# Patient Record
Sex: Female | Born: 1935
Health system: Southern US, Community
[De-identification: ages and names within clinical notes are randomized; demographics above are authoritative.]

## PROBLEM LIST (undated history)

## (undated) DIAGNOSIS — I10 Essential (primary) hypertension: Secondary | ICD-10-CM

## (undated) HISTORY — DX: Essential (primary) hypertension: I10

---

## 1998-08-31 HISTORY — PX: CATARACT EXTRACTION: SUR2

## 1999-08-03 ENCOUNTER — Encounter: Payer: Self-pay | Admitting: Family Medicine

## 1999-08-03 ENCOUNTER — Encounter: Admission: RE | Admit: 1999-08-03 | Discharge: 1999-08-03 | Payer: Self-pay | Admitting: Family Medicine

## 2000-08-14 ENCOUNTER — Encounter: Payer: Self-pay | Admitting: Gynecology

## 2000-08-14 ENCOUNTER — Encounter: Admission: RE | Admit: 2000-08-14 | Discharge: 2000-08-14 | Payer: Self-pay | Admitting: Gynecology

## 2000-09-25 ENCOUNTER — Other Ambulatory Visit: Admission: RE | Admit: 2000-09-25 | Discharge: 2000-09-25 | Payer: Self-pay | Admitting: Gynecology

## 2000-09-28 ENCOUNTER — Encounter: Payer: Self-pay | Admitting: Gynecology

## 2000-09-28 ENCOUNTER — Encounter: Admission: RE | Admit: 2000-09-28 | Discharge: 2000-09-28 | Payer: Self-pay | Admitting: Gynecology

## 2001-10-09 ENCOUNTER — Other Ambulatory Visit: Admission: RE | Admit: 2001-10-09 | Discharge: 2001-10-09 | Payer: Self-pay | Admitting: Gynecology

## 2001-10-23 ENCOUNTER — Encounter: Admission: RE | Admit: 2001-10-23 | Discharge: 2001-10-23 | Payer: Self-pay | Admitting: Gynecology

## 2001-10-23 ENCOUNTER — Encounter: Payer: Self-pay | Admitting: Gynecology

## 2002-10-22 ENCOUNTER — Encounter: Payer: Self-pay | Admitting: Gynecology

## 2002-10-22 ENCOUNTER — Encounter: Admission: RE | Admit: 2002-10-22 | Discharge: 2002-10-22 | Payer: Self-pay | Admitting: Gynecology

## 2002-11-05 ENCOUNTER — Other Ambulatory Visit: Admission: RE | Admit: 2002-11-05 | Discharge: 2002-11-05 | Payer: Self-pay | Admitting: Gynecology

## 2003-11-04 ENCOUNTER — Encounter: Admission: RE | Admit: 2003-11-04 | Discharge: 2003-11-04 | Payer: Self-pay | Admitting: Gynecology

## 2003-12-02 ENCOUNTER — Other Ambulatory Visit: Admission: RE | Admit: 2003-12-02 | Discharge: 2003-12-02 | Payer: Self-pay | Admitting: Gynecology

## 2003-12-25 ENCOUNTER — Ambulatory Visit (HOSPITAL_COMMUNITY): Admission: RE | Admit: 2003-12-25 | Discharge: 2003-12-25 | Payer: Self-pay | Admitting: Gynecology

## 2004-11-03 ENCOUNTER — Encounter: Admission: RE | Admit: 2004-11-03 | Discharge: 2004-11-03 | Payer: Self-pay | Admitting: Gynecology

## 2004-11-16 ENCOUNTER — Other Ambulatory Visit: Admission: RE | Admit: 2004-11-16 | Discharge: 2004-11-16 | Payer: Self-pay | Admitting: Gynecology

## 2005-11-07 ENCOUNTER — Encounter: Admission: RE | Admit: 2005-11-07 | Discharge: 2005-11-07 | Payer: Self-pay | Admitting: Gynecology

## 2005-11-22 ENCOUNTER — Other Ambulatory Visit: Admission: RE | Admit: 2005-11-22 | Discharge: 2005-11-22 | Payer: Self-pay | Admitting: Gynecology

## 2006-11-10 ENCOUNTER — Encounter: Admission: RE | Admit: 2006-11-10 | Discharge: 2006-11-10 | Payer: Self-pay | Admitting: Gynecology

## 2006-12-05 ENCOUNTER — Other Ambulatory Visit: Admission: RE | Admit: 2006-12-05 | Discharge: 2006-12-05 | Payer: Self-pay | Admitting: Gynecology

## 2007-06-13 ENCOUNTER — Encounter: Admission: RE | Admit: 2007-06-13 | Discharge: 2007-06-13 | Payer: Self-pay | Admitting: Family Medicine

## 2007-10-20 ENCOUNTER — Inpatient Hospital Stay (HOSPITAL_COMMUNITY): Admission: AD | Admit: 2007-10-20 | Discharge: 2007-10-20 | Payer: Self-pay | Admitting: Obstetrics & Gynecology

## 2007-11-14 ENCOUNTER — Encounter: Admission: RE | Admit: 2007-11-14 | Discharge: 2007-11-14 | Payer: Self-pay | Admitting: Gynecology

## 2007-12-25 ENCOUNTER — Encounter: Admission: RE | Admit: 2007-12-25 | Discharge: 2007-12-25 | Payer: Self-pay | Admitting: Family Medicine

## 2008-09-29 ENCOUNTER — Encounter: Admission: RE | Admit: 2008-09-29 | Discharge: 2008-09-29 | Payer: Self-pay | Admitting: Family Medicine

## 2008-11-19 ENCOUNTER — Encounter: Admission: RE | Admit: 2008-11-19 | Discharge: 2008-11-19 | Payer: Self-pay | Admitting: Gynecology

## 2009-12-02 ENCOUNTER — Encounter: Admission: RE | Admit: 2009-12-02 | Discharge: 2009-12-02 | Payer: Self-pay | Admitting: Gynecology

## 2009-12-10 ENCOUNTER — Encounter: Admission: RE | Admit: 2009-12-10 | Discharge: 2009-12-10 | Payer: Self-pay | Admitting: Gynecology

## 2010-04-04 ENCOUNTER — Encounter: Payer: Self-pay | Admitting: Family Medicine

## 2010-07-04 IMAGING — US US PELVIS COMPLETE
1 series · 14 of 25 positions shown · non-contrast
Comparison: None.

CLINICAL DATA: Lower abdominal pain and bloating.  Weight loss.
History of ovarian cyst removal and pessary.

TRANSABDOMINAL ULTRASOUND OF PELVIS 09/29/2008:
TECHNIQUE: Transabdominal ultrasound examination of the pelvis was
performed including evaluation of the uterus, ovaries, adnexal
regions, and pelvic cul-de-sac.

[Series 1: us pelvis complete · 0.29mm/px · 14 of 31 slices shown]
[im 1/31]
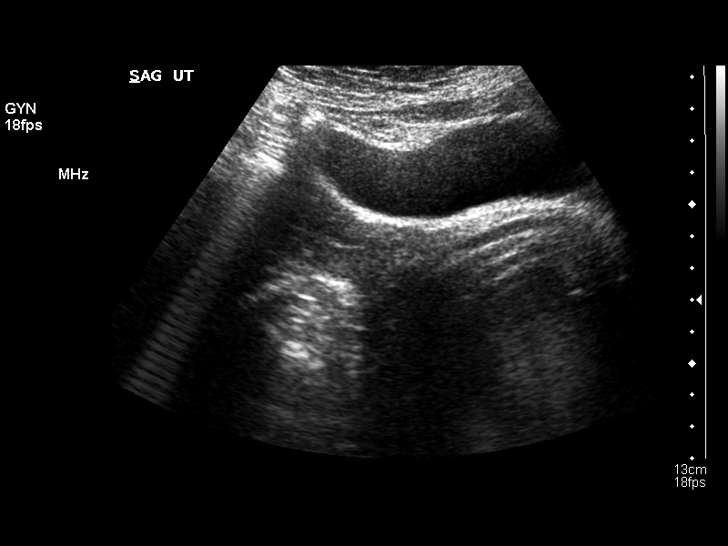
[im 3/31]
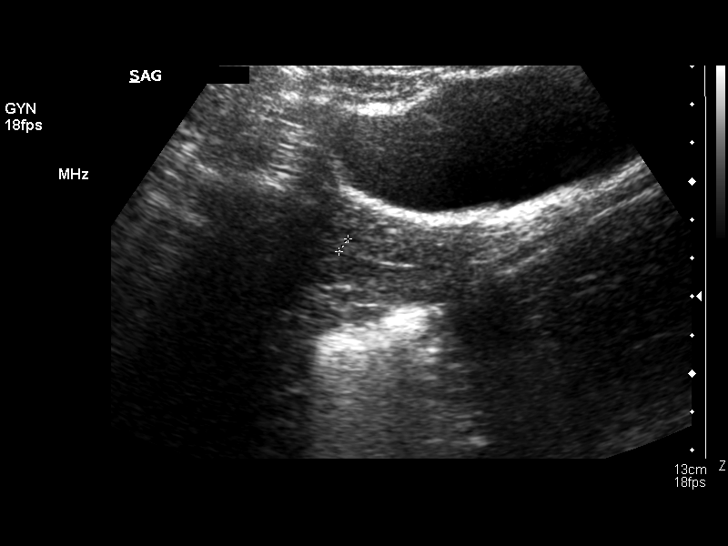
[im 6/31]
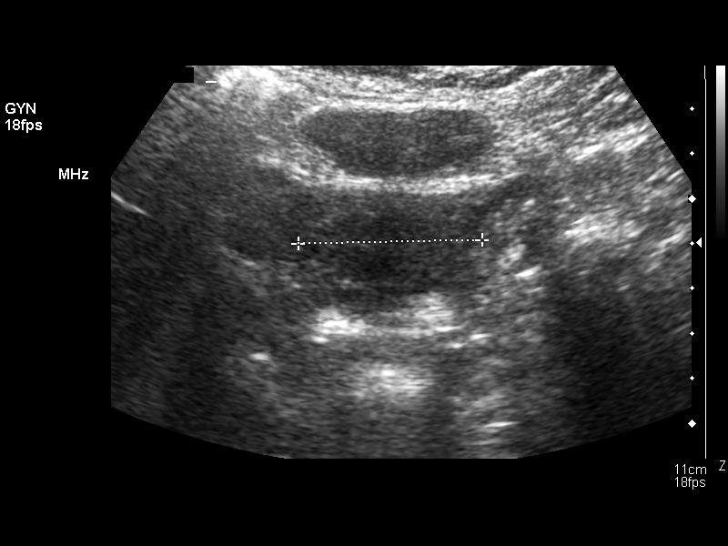
[im 8/31]
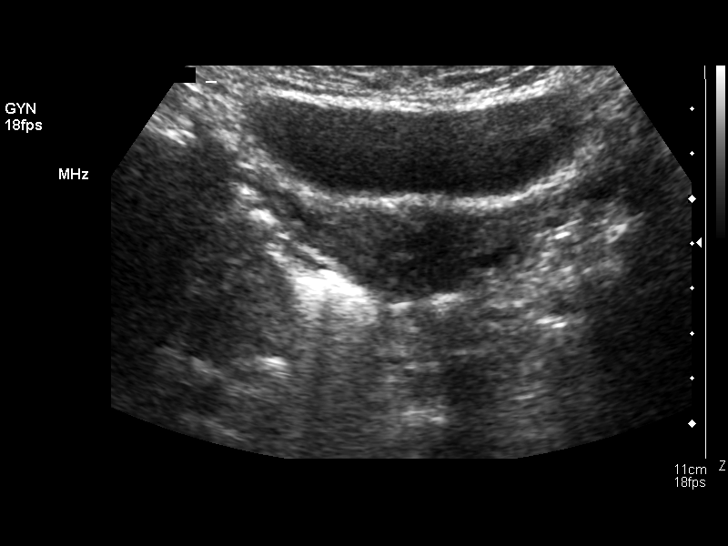
[im 11/31]
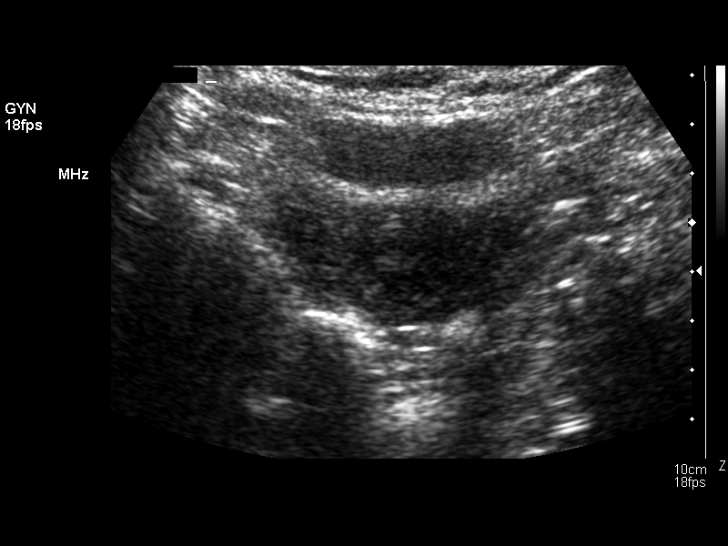
[im 12/31]
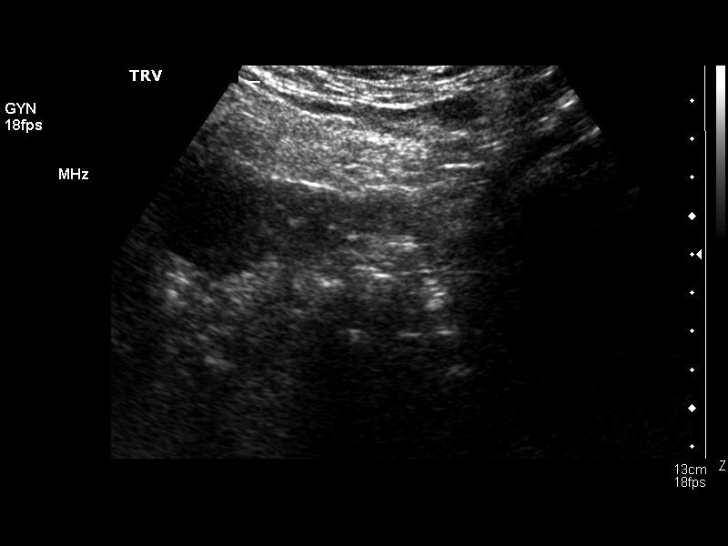
[im 14/31]
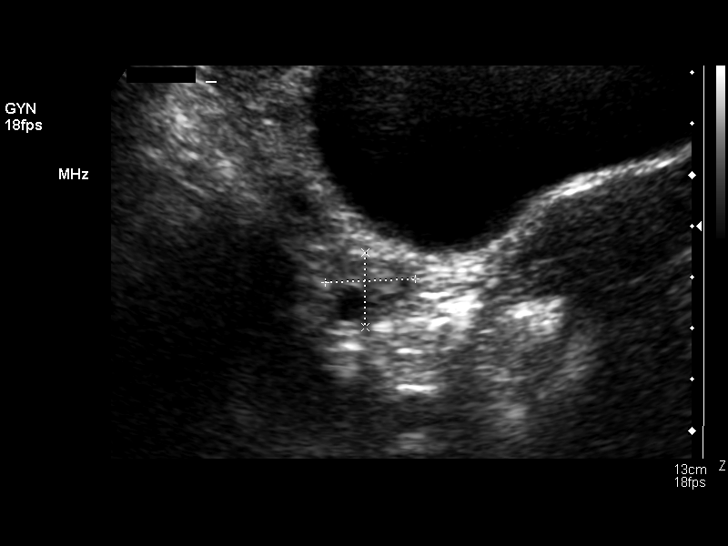
[im 17/31]
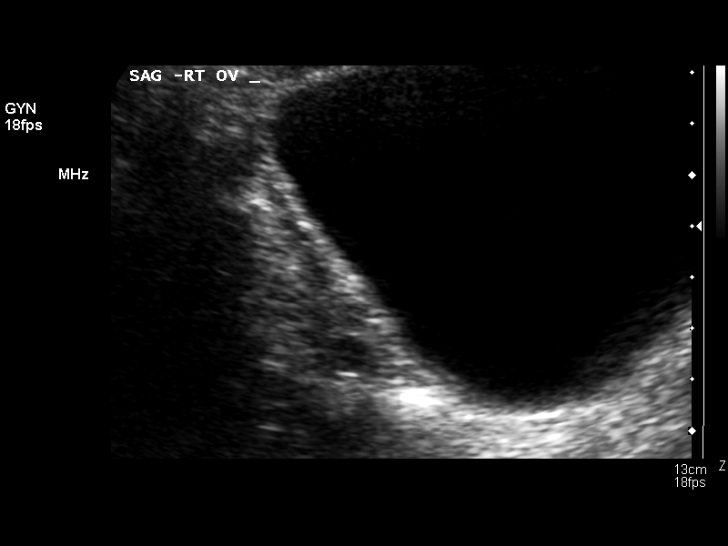
[im 19/31]
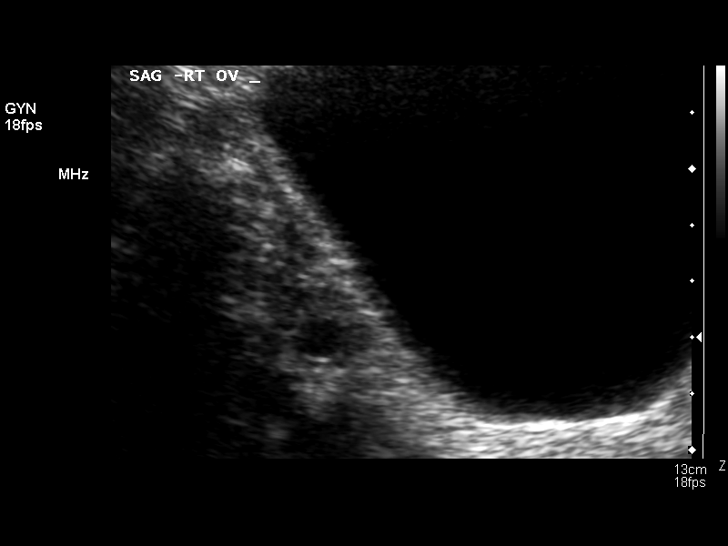
[im 21/31]
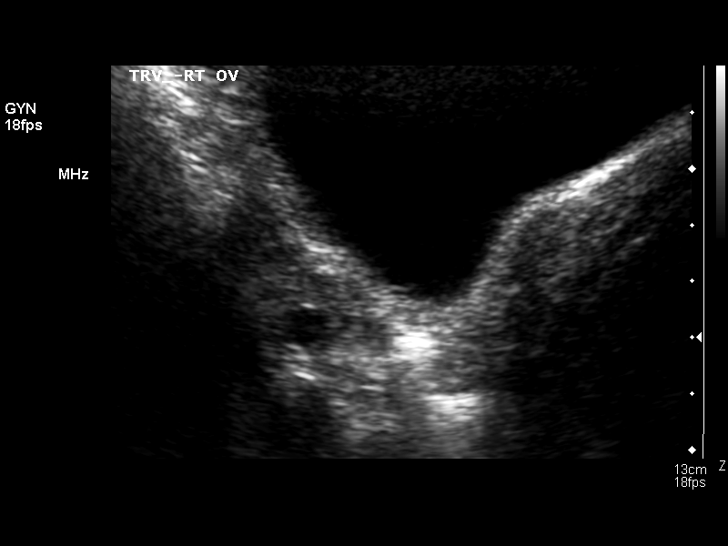
[im 23/31]
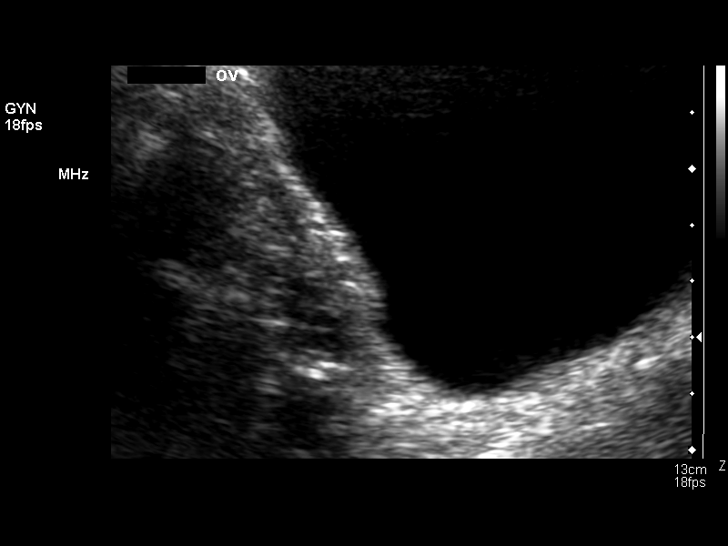
[im 26/31]
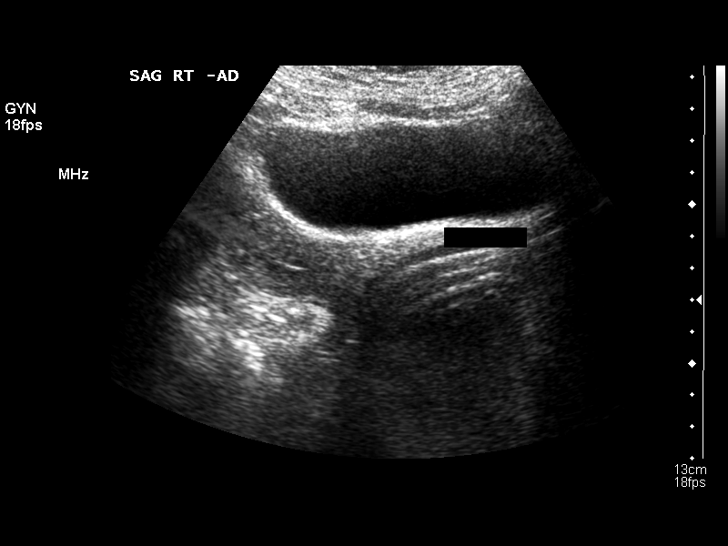
[im 28/31]
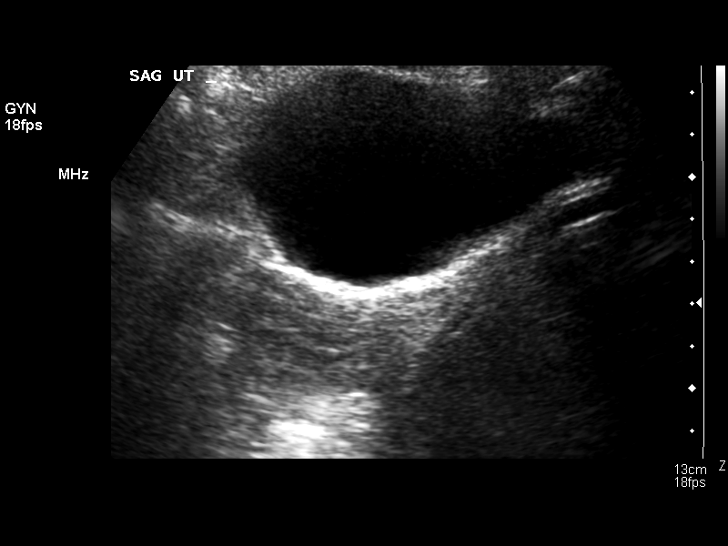
[im 31/31]
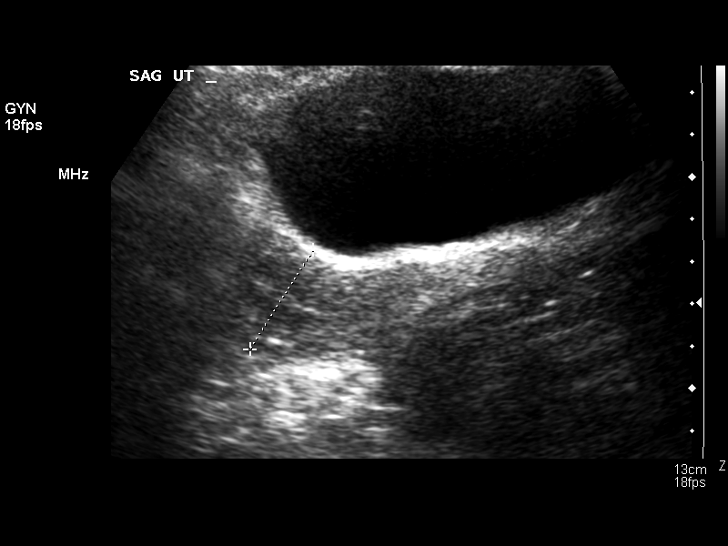

[14 of 25 positions shown; findings below may reference images not displayed]

FINDINGS: Uterus:  Atrophic consistent with age measuring 5.2 x 2.8 x 4.1 cm.
No focal myometrial abnormalities.

Endometrium:  Normal in appearance measuring approximately 4 mm in
thickness.

Right Ovary:  Normal in size for age measuring approximately 2.5 x
1.5 x 1.8 cm, containing an approximate 0.9 cm simple cyst.  No
dominant cyst or solid mass.

Left Ovary:  Not visualized.

Other Findings:  No adnexal masses or free fluid.
IMPRESSION: 1.  Normal appearing atrophic uterus consistent with age.
2.  Normal-appearing right ovary containing a subcentimeter simple
cyst.  No dominant cyst.
3.  Nonvisualization of the left ovary.
4.  No adnexal masses or free fluid.

## 2010-07-27 NOTE — Consult Note (Signed)
Deborah Solomon, Deborah Solomon                ACCOUNT NO.:  [id number]   MEDICAL RECORD NO.:  [id number]          PATIENT TYPE:  MAT   LOCATION:  [facility]                          FACILITY:  [facility]   PHYSICIAN:  M. Leda Quail, MD  DATE OF BIRTH:  Aug 23, 1935   DATE OF CONSULTATION:  DATE OF DISCHARGE:                                 CONSULTATION   CHIEF COMPLAINT:  Pain secondary to a pessary.   HISTORY OF PRESENT ILLNESS:  Deborah Solomon is a 75 year old white female,  who is the patient of Dr. Nicholas Lose, who called me this morning stating that  she feels like her pessary has moved and is very uncomfortable.  She had  had a Gellhorn pessary placed in Dr. Johnn Hai office about 2 weeks ago,  and she has done very well with it, but she feels like with the  applicator of vaginal estrogen cream that this has caused pessary to  move, and today she woke with it being very uncomfortable with a lot of  rectal pressure.  Over the phone, I tried to guide her on how to move  it, but she felt like all she could get touch of it was the edge.  She  is already seeing another person in triage at the hospital and she  wanted to be seen, so I had told her to just come on to [facility]  and I would replace the pessary.  She has had no vaginal bleeding or  other discharge.   PHYSICAL EXAMINATION:  VITAL SIGNS:  Respirations are 18, pulse is 82,  and blood pressure is 154/92.  GENERAL:  Well nourished, well developed white female in no acute  distress.  GYN:  Normal appearing external female genitalia.  The Gellhorn pessary  has slipped around so that the knob is facing the superior aspect of the  vagina and the base portion is actually pressed toward the rectum so I  completely understand her symptoms.  The pessary was flipped without  difficulty and repositioned properly.  The patient felt an immediate  change in her comfort level.  She was able to stand up and walk and void  without difficulty.  At this point, she  was discharged home.  She does  have my telephone number in case if she has any other issues this  weekend.  She is going to call Dr. Nicholas Lose if she has any other issues in  the future.   ASSESSMENT AND PLAN:  The patient with pelvic relaxation with  malpositioned pessary.   PLAN:  The patient was discharged to home and will followup with Dr.  Nicholas Lose as needed.      Lum Keas, MD  Electronically Signed     MSM/MEDQ  D:  10/20/2007  T:  10/20/2007  Job:  [id number]   cc:   Gretta Cool, M.D.  Fax: [phone]

## 2010-11-08 ENCOUNTER — Other Ambulatory Visit: Payer: Self-pay | Admitting: Gynecology

## 2010-11-08 DIAGNOSIS — Z1231 Encounter for screening mammogram for malignant neoplasm of breast: Secondary | ICD-10-CM

## 2010-12-15 ENCOUNTER — Ambulatory Visit
Admission: RE | Admit: 2010-12-15 | Discharge: 2010-12-15 | Disposition: A | Discharge status: Home / Self Care | Payer: Medicare PPO | Source: Ambulatory Visit | Attending: Gynecology | Admitting: Gynecology

## 2010-12-15 DIAGNOSIS — Z1231 Encounter for screening mammogram for malignant neoplasm of breast: Secondary | ICD-10-CM

## 2011-12-27 ENCOUNTER — Other Ambulatory Visit: Payer: Self-pay | Admitting: Gynecology

## 2011-12-27 DIAGNOSIS — M949 Disorder of cartilage, unspecified: Secondary | ICD-10-CM

## 2011-12-27 DIAGNOSIS — Z1231 Encounter for screening mammogram for malignant neoplasm of breast: Secondary | ICD-10-CM

## 2011-12-27 DIAGNOSIS — M899 Disorder of bone, unspecified: Secondary | ICD-10-CM

## 2012-02-03 ENCOUNTER — Ambulatory Visit
Admission: RE | Admit: 2012-02-03 | Discharge: 2012-02-03 | Disposition: A | Discharge status: Home / Self Care | Payer: Medicare Other | Source: Ambulatory Visit | Attending: Gynecology | Admitting: Gynecology

## 2012-02-03 DIAGNOSIS — M899 Disorder of bone, unspecified: Secondary | ICD-10-CM

## 2012-02-03 DIAGNOSIS — M949 Disorder of cartilage, unspecified: Secondary | ICD-10-CM

## 2012-02-03 DIAGNOSIS — Z1231 Encounter for screening mammogram for malignant neoplasm of breast: Secondary | ICD-10-CM

## 2015-02-27 ENCOUNTER — Other Ambulatory Visit: Payer: Self-pay | Admitting: Gastroenterology

## 2015-02-27 DIAGNOSIS — R160 Hepatomegaly, not elsewhere classified: Secondary | ICD-10-CM

## 2015-03-12 ENCOUNTER — Ambulatory Visit
Admission: RE | Admit: 2015-03-12 | Discharge: 2015-03-12 | Disposition: A | Discharge status: Home / Self Care | Payer: Medicare Other | Source: Ambulatory Visit | Attending: Gastroenterology | Admitting: Gastroenterology

## 2015-03-12 DIAGNOSIS — R160 Hepatomegaly, not elsewhere classified: Secondary | ICD-10-CM

## 2015-03-19 ENCOUNTER — Other Ambulatory Visit: Payer: Self-pay | Admitting: Gastroenterology

## 2015-03-19 DIAGNOSIS — K769 Liver disease, unspecified: Secondary | ICD-10-CM

## 2015-04-01 ENCOUNTER — Ambulatory Visit
Admission: RE | Admit: 2015-04-01 | Discharge: 2015-04-01 | Disposition: A | Discharge status: Home / Self Care | Payer: Medicare Other | Source: Ambulatory Visit | Attending: Gastroenterology | Admitting: Gastroenterology

## 2015-04-01 DIAGNOSIS — K769 Liver disease, unspecified: Secondary | ICD-10-CM

## 2015-04-01 MED ORDER — GADOBENATE DIMEGLUMINE 529 MG/ML IV SOLN
15.0000 mL | Freq: Once | INTRAVENOUS | Status: AC | PRN
  Administered 2015-04-01: 15 mL via INTRAVENOUS

## 2016-04-08 DIAGNOSIS — H5201 Hypermetropia, right eye: Secondary | ICD-10-CM | POA: Diagnosis not present

## 2016-04-14 DIAGNOSIS — H353230 Exudative age-related macular degeneration, bilateral, stage unspecified: Secondary | ICD-10-CM | POA: Diagnosis not present

## 2016-04-14 DIAGNOSIS — H1851 Endothelial corneal dystrophy: Secondary | ICD-10-CM | POA: Diagnosis not present

## 2016-04-14 DIAGNOSIS — Z961 Presence of intraocular lens: Secondary | ICD-10-CM | POA: Diagnosis not present

## 2016-04-14 DIAGNOSIS — H04123 Dry eye syndrome of bilateral lacrimal glands: Secondary | ICD-10-CM | POA: Diagnosis not present

## 2016-05-12 DIAGNOSIS — H353230 Exudative age-related macular degeneration, bilateral, stage unspecified: Secondary | ICD-10-CM | POA: Diagnosis not present

## 2016-05-23 DIAGNOSIS — K219 Gastro-esophageal reflux disease without esophagitis: Secondary | ICD-10-CM | POA: Diagnosis not present

## 2016-05-23 DIAGNOSIS — R7303 Prediabetes: Secondary | ICD-10-CM | POA: Diagnosis not present

## 2016-05-23 DIAGNOSIS — Z6829 Body mass index (BMI) 29.0-29.9, adult: Secondary | ICD-10-CM | POA: Diagnosis not present

## 2016-05-23 DIAGNOSIS — I10 Essential (primary) hypertension: Secondary | ICD-10-CM | POA: Diagnosis not present

## 2016-05-23 DIAGNOSIS — E782 Mixed hyperlipidemia: Secondary | ICD-10-CM | POA: Diagnosis not present

## 2016-05-23 DIAGNOSIS — Z1389 Encounter for screening for other disorder: Secondary | ICD-10-CM | POA: Diagnosis not present

## 2016-05-23 DIAGNOSIS — E042 Nontoxic multinodular goiter: Secondary | ICD-10-CM | POA: Diagnosis not present

## 2016-05-23 DIAGNOSIS — E663 Overweight: Secondary | ICD-10-CM | POA: Diagnosis not present

## 2016-06-07 DIAGNOSIS — Z4689 Encounter for fitting and adjustment of other specified devices: Secondary | ICD-10-CM | POA: Diagnosis not present

## 2016-06-14 DIAGNOSIS — H353230 Exudative age-related macular degeneration, bilateral, stage unspecified: Secondary | ICD-10-CM | POA: Diagnosis not present

## 2016-07-05 DIAGNOSIS — Z79899 Other long term (current) drug therapy: Secondary | ICD-10-CM | POA: Diagnosis not present

## 2016-07-05 DIAGNOSIS — H9313 Tinnitus, bilateral: Secondary | ICD-10-CM | POA: Diagnosis not present

## 2016-07-05 DIAGNOSIS — Z0000 Encounter for general adult medical examination without abnormal findings: Secondary | ICD-10-CM | POA: Diagnosis not present

## 2016-07-05 DIAGNOSIS — Z87891 Personal history of nicotine dependence: Secondary | ICD-10-CM | POA: Diagnosis not present

## 2016-07-05 DIAGNOSIS — E669 Obesity, unspecified: Secondary | ICD-10-CM | POA: Diagnosis not present

## 2016-07-05 DIAGNOSIS — I10 Essential (primary) hypertension: Secondary | ICD-10-CM | POA: Diagnosis not present

## 2016-07-05 DIAGNOSIS — J3089 Other allergic rhinitis: Secondary | ICD-10-CM | POA: Diagnosis not present

## 2016-07-05 DIAGNOSIS — Z7982 Long term (current) use of aspirin: Secondary | ICD-10-CM | POA: Diagnosis not present

## 2016-07-05 DIAGNOSIS — Z6831 Body mass index (BMI) 31.0-31.9, adult: Secondary | ICD-10-CM | POA: Diagnosis not present

## 2016-07-05 DIAGNOSIS — Z79890 Hormone replacement therapy (postmenopausal): Secondary | ICD-10-CM | POA: Diagnosis not present

## 2016-07-12 DIAGNOSIS — H1851 Endothelial corneal dystrophy: Secondary | ICD-10-CM | POA: Diagnosis not present

## 2016-07-12 DIAGNOSIS — H04123 Dry eye syndrome of bilateral lacrimal glands: Secondary | ICD-10-CM | POA: Diagnosis not present

## 2016-07-12 DIAGNOSIS — Z961 Presence of intraocular lens: Secondary | ICD-10-CM | POA: Diagnosis not present

## 2016-07-12 DIAGNOSIS — H353230 Exudative age-related macular degeneration, bilateral, stage unspecified: Secondary | ICD-10-CM | POA: Diagnosis not present

## 2016-07-20 DIAGNOSIS — Z6829 Body mass index (BMI) 29.0-29.9, adult: Secondary | ICD-10-CM | POA: Diagnosis not present

## 2016-07-20 DIAGNOSIS — N390 Urinary tract infection, site not specified: Secondary | ICD-10-CM | POA: Diagnosis not present

## 2016-07-20 DIAGNOSIS — I10 Essential (primary) hypertension: Secondary | ICD-10-CM | POA: Diagnosis not present

## 2016-09-05 DIAGNOSIS — N390 Urinary tract infection, site not specified: Secondary | ICD-10-CM | POA: Diagnosis not present

## 2016-09-05 DIAGNOSIS — R7303 Prediabetes: Secondary | ICD-10-CM | POA: Diagnosis not present

## 2016-09-05 DIAGNOSIS — I10 Essential (primary) hypertension: Secondary | ICD-10-CM | POA: Diagnosis not present

## 2016-09-05 DIAGNOSIS — E782 Mixed hyperlipidemia: Secondary | ICD-10-CM | POA: Diagnosis not present

## 2016-09-05 DIAGNOSIS — Z6829 Body mass index (BMI) 29.0-29.9, adult: Secondary | ICD-10-CM | POA: Diagnosis not present

## 2016-09-12 DIAGNOSIS — H04123 Dry eye syndrome of bilateral lacrimal glands: Secondary | ICD-10-CM | POA: Diagnosis not present

## 2016-09-12 DIAGNOSIS — H353230 Exudative age-related macular degeneration, bilateral, stage unspecified: Secondary | ICD-10-CM | POA: Diagnosis not present

## 2016-09-12 DIAGNOSIS — Z961 Presence of intraocular lens: Secondary | ICD-10-CM | POA: Diagnosis not present

## 2016-09-12 DIAGNOSIS — H1851 Endothelial corneal dystrophy: Secondary | ICD-10-CM | POA: Diagnosis not present

## 2016-10-11 DIAGNOSIS — Z01419 Encounter for gynecological examination (general) (routine) without abnormal findings: Secondary | ICD-10-CM | POA: Diagnosis not present

## 2016-10-11 DIAGNOSIS — Z79890 Hormone replacement therapy (postmenopausal): Secondary | ICD-10-CM | POA: Diagnosis not present

## 2016-10-11 DIAGNOSIS — N8111 Cystocele, midline: Secondary | ICD-10-CM | POA: Diagnosis not present

## 2016-10-11 DIAGNOSIS — Z1231 Encounter for screening mammogram for malignant neoplasm of breast: Secondary | ICD-10-CM | POA: Diagnosis not present

## 2016-10-11 DIAGNOSIS — Z4689 Encounter for fitting and adjustment of other specified devices: Secondary | ICD-10-CM | POA: Diagnosis not present

## 2016-10-12 DIAGNOSIS — H353230 Exudative age-related macular degeneration, bilateral, stage unspecified: Secondary | ICD-10-CM | POA: Diagnosis not present

## 2016-10-20 DIAGNOSIS — Z0000 Encounter for general adult medical examination without abnormal findings: Secondary | ICD-10-CM | POA: Diagnosis not present

## 2016-10-20 DIAGNOSIS — Z1389 Encounter for screening for other disorder: Secondary | ICD-10-CM | POA: Diagnosis not present

## 2016-10-20 DIAGNOSIS — Z136 Encounter for screening for cardiovascular disorders: Secondary | ICD-10-CM | POA: Diagnosis not present

## 2016-10-20 DIAGNOSIS — E785 Hyperlipidemia, unspecified: Secondary | ICD-10-CM | POA: Diagnosis not present

## 2016-10-20 DIAGNOSIS — Z9181 History of falling: Secondary | ICD-10-CM | POA: Diagnosis not present

## 2016-10-20 DIAGNOSIS — Z23 Encounter for immunization: Secondary | ICD-10-CM | POA: Diagnosis not present

## 2016-12-21 DIAGNOSIS — R69 Illness, unspecified: Secondary | ICD-10-CM | POA: Diagnosis not present

## 2016-12-29 DIAGNOSIS — I10 Essential (primary) hypertension: Secondary | ICD-10-CM | POA: Diagnosis not present

## 2016-12-29 DIAGNOSIS — R7303 Prediabetes: Secondary | ICD-10-CM | POA: Diagnosis not present

## 2016-12-29 DIAGNOSIS — E042 Nontoxic multinodular goiter: Secondary | ICD-10-CM | POA: Diagnosis not present

## 2016-12-29 DIAGNOSIS — E782 Mixed hyperlipidemia: Secondary | ICD-10-CM | POA: Diagnosis not present

## 2016-12-29 DIAGNOSIS — K219 Gastro-esophageal reflux disease without esophagitis: Secondary | ICD-10-CM | POA: Diagnosis not present

## 2016-12-29 DIAGNOSIS — Z6829 Body mass index (BMI) 29.0-29.9, adult: Secondary | ICD-10-CM | POA: Diagnosis not present

## 2017-01-03 IMAGING — MR MR ABDOMEN WO/W CM
12 of 17 series · 30 of 48 positions shown · IV contrast (multihance)
Comparison: Ultrasound 03/12/2015

CLINICAL DATA: Followup abnormal recent ultrasound examination
suggesting liver lesions.

Creatinine was obtained on site at [HOSPITAL] at [HOSPITAL].Results: Creatinine 0.7 mg/dL.
EXAM:
MRI ABDOMEN WITHOUT AND WITH CONTRAST
TECHNIQUE: Multiplanar multisequence MR imaging of the abdomen was performed
both before and after the administration of intravenous contrast.
CONTRAST:  15mL MULTIHANCE GADOBENATE DIMEGLUMINE 529 MG/ML IV SOLN

[Series 3: T2 · coronal · 5.0mm · 1.56mm/px · 1 of 32 slices shown (1 of 3)]
[im 1/32]
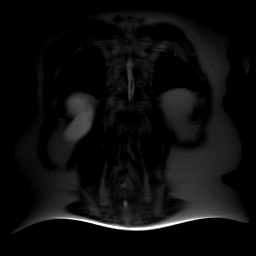

[Series 4: axial tru fisp · axial · 4.0mm · 1.48mm/px · 1 of 40 slices shown]
[im 1/40]
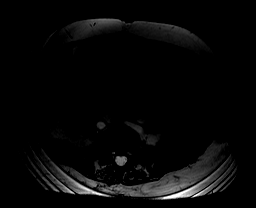

[Series 5: T2 · axial · 4.9mm · 1.37mm/px · 1 of 40 slices shown (2 of 3)]
[im 1/40]
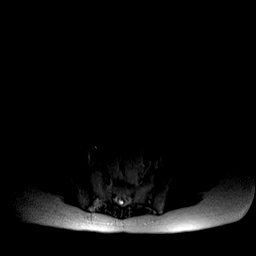

[Series 6: axial in out · axial · 6.0mm · 0.74mm/px · z∈[-158,+83]mm · 2 of 72 slices shown]
[im 1/72]
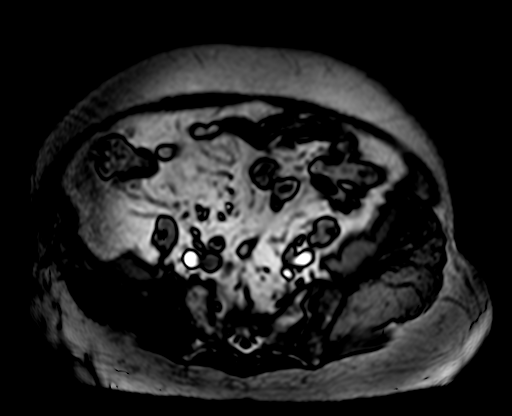
[im 72/72]
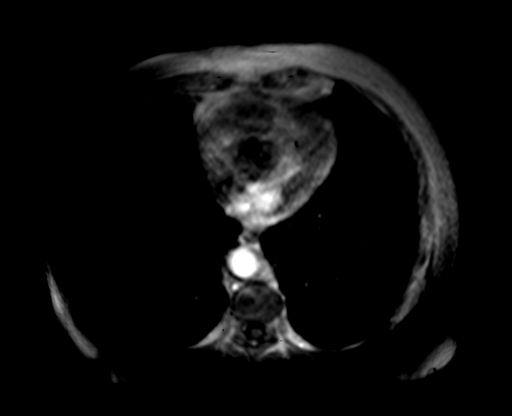

[Series 7: T2 · axial · 6.5mm · 0.74mm/px · 1 of 33 slices shown (3 of 3)]
[im 1/33]
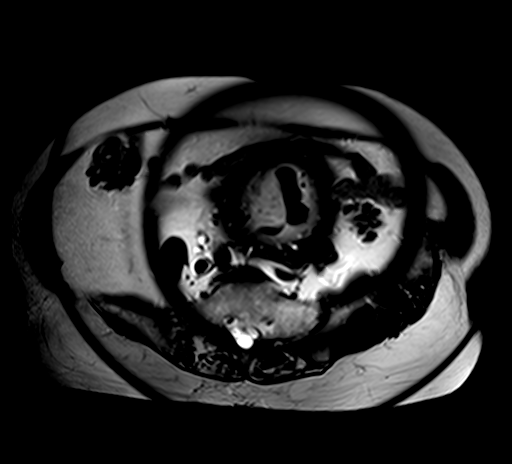

[Series 8: ep2d_diff_b50_500_800_p2 · axial · 6.5mm · 1.98mm/px · z∈[-138,+111]mm · 3 of 99 slices shown]
[im 1/99]
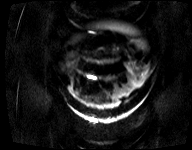
[im 50/99]
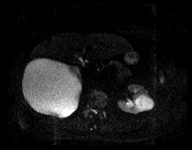
[im 99/99]
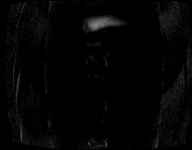

[Series 9: ep2d_[id number]_800_p2_adc · axial · 6.5mm · 1.98mm/px · 1 of 33 slices shown]
[im 1/33]
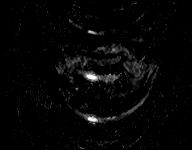

[Series 10: T1 dynamic · axial · non-contrast · 2.0mm · 0.78mm/px · z∈[-149,+73]mm · 4 of 112 slices shown]
[im 1/112]
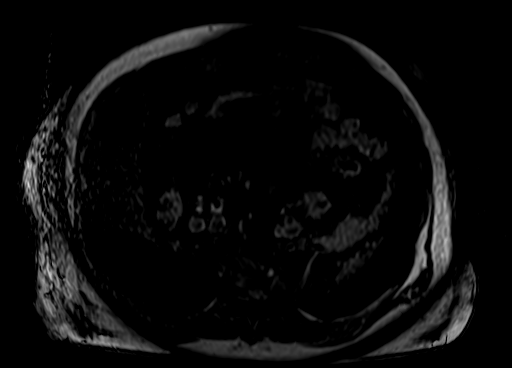
[im 38/112]
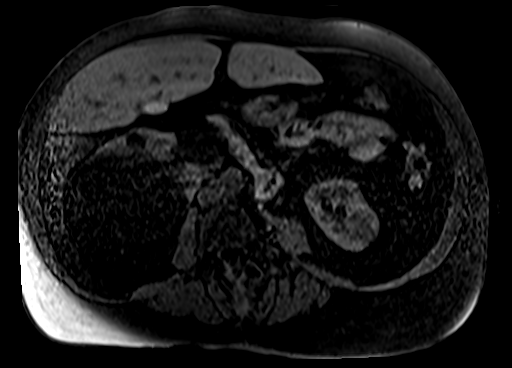
[im 75/112]
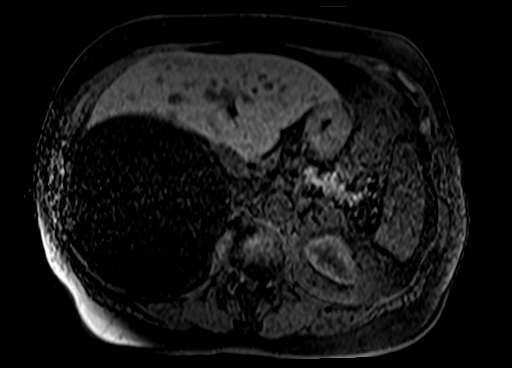
[im 112/112]
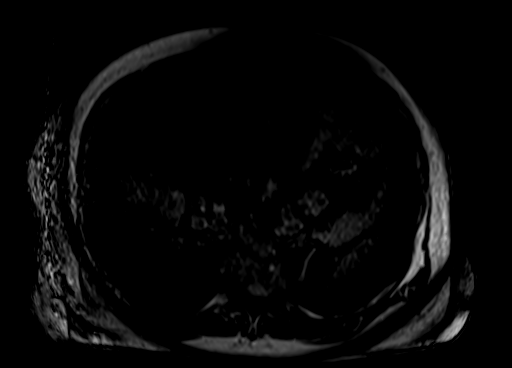

[Series 11: post 25 sec · axial · 2.0mm · 0.78mm/px · z∈[-149,+73]mm · 4 of 112 slices shown]
[im 1/112]
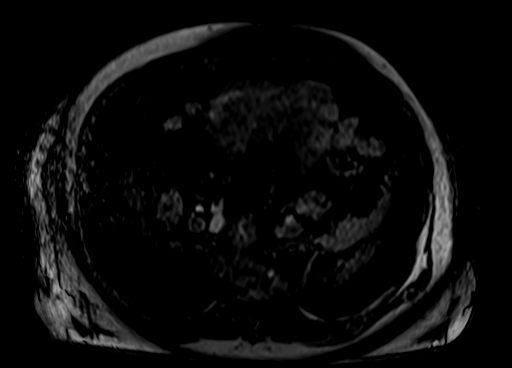
[im 38/112]
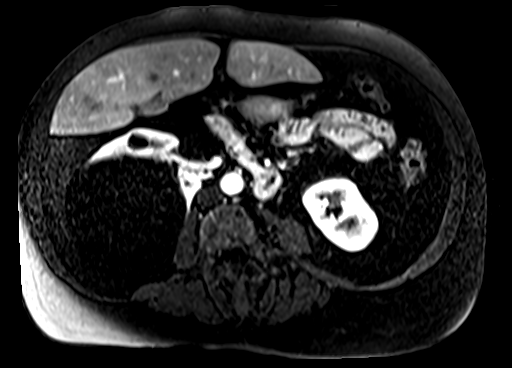
[im 75/112]
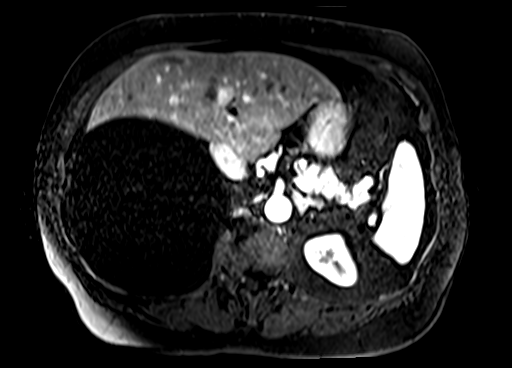
[im 112/112]
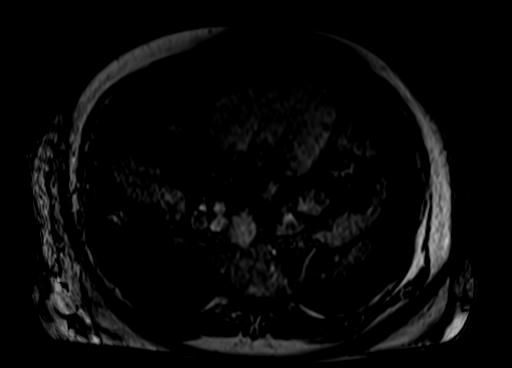

[Series 12: post 25 sec_sub · axial · 2.0mm · 0.78mm/px · z∈[-149,+73]mm · 4 of 112 slices shown]
[im 1/112]
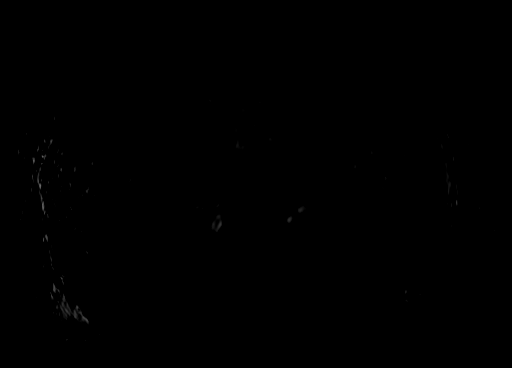
[im 38/112]
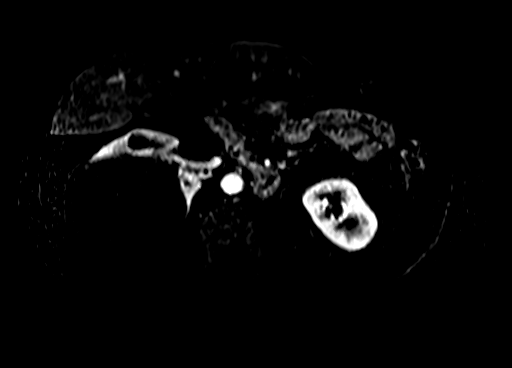
[im 75/112]
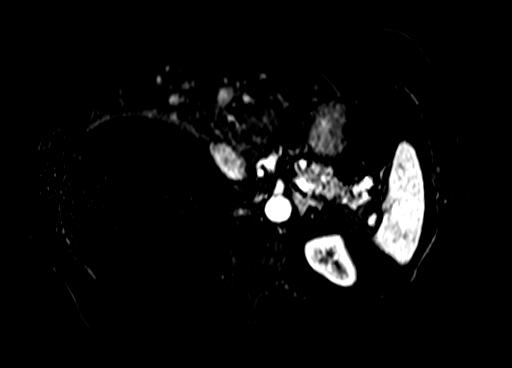
[im 112/112]
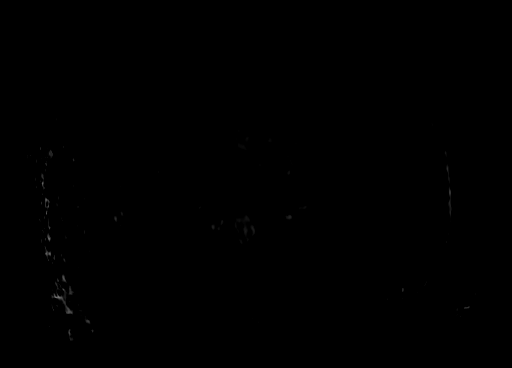

[Series 13: post 45 sec · axial · 2.0mm · 0.78mm/px · z∈[-149,+73]mm · 4 of 112 slices shown]
[im 1/112]
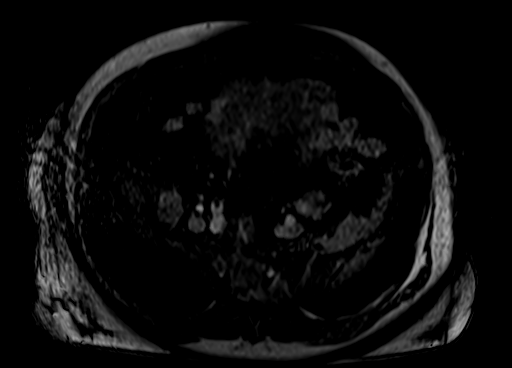
[im 38/112]
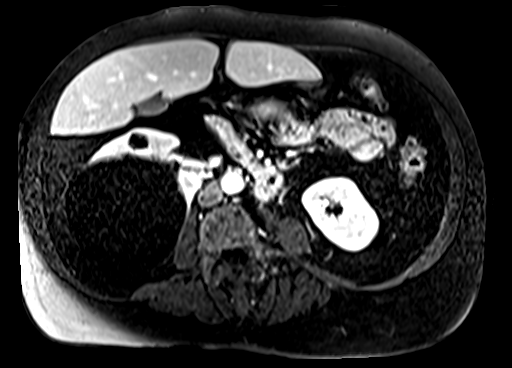
[im 75/112]
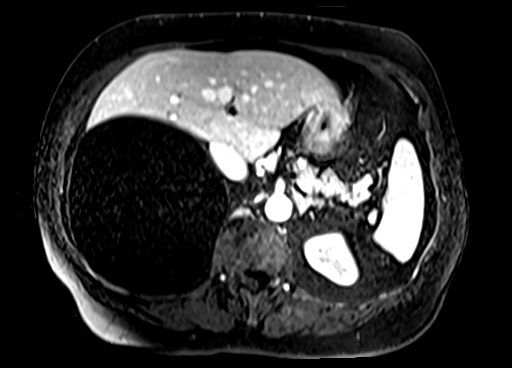
[im 112/112]
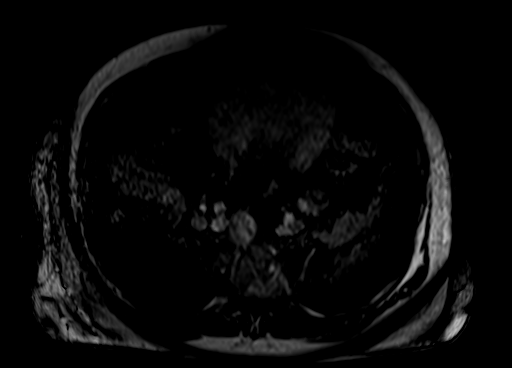

[Series 14: post 45 sec_sub · axial · 2.0mm · 0.78mm/px · z∈[-149,+73]mm · 4 of 112 slices shown]
[im 1/112]
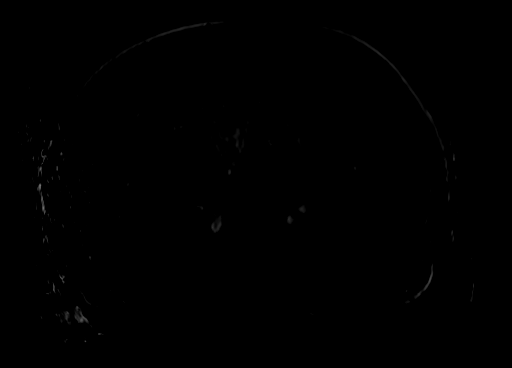
[im 38/112]
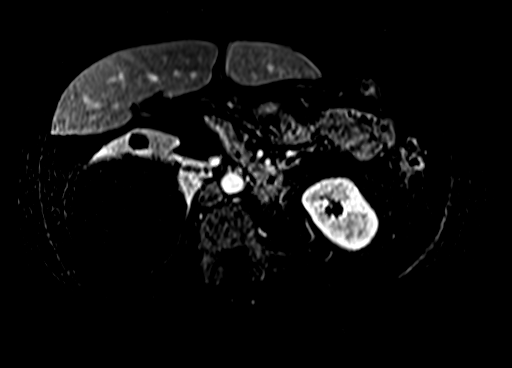
[im 75/112]
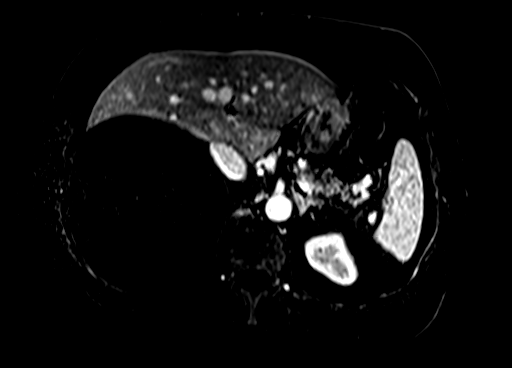
[im 112/112]
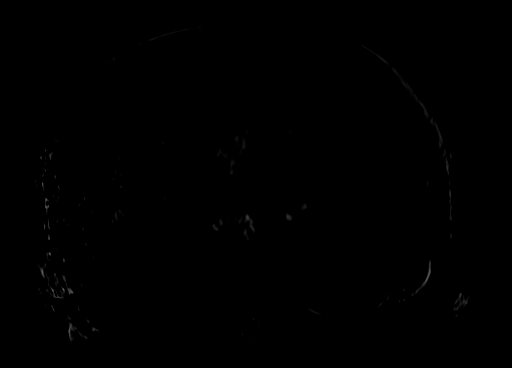

[30 of 48 positions shown; findings below may reference images not displayed]

FINDINGS: Lower chest: The lung bases are clear. No pulmonary lesions or
pleural effusion. The heart is normal in size. No pericardial
effusion.

Hepatobiliary: No focal hepatic lesions or intrahepatic biliary
dilatation. There is moderate mass effect on the right hepatic lobe
by a very large right renal cyst. The gallbladder is normal. No
common bile duct dilatation.

Pancreas: No mass, inflammation or ductal dilatation. There is a 7
mm cyst in the pancreatic body. No worrisome enhancement. Followup
MR examination in 1 year is suggested.

Spleen: Normal size.  Upper pole cysts are noted.

Adrenals/Urinary Tract: Both adrenal glands are normal. There are
bilateral renal cysts. The largest renal cyst measures 18.5 cm and
does have mass effect on the liver, pancreas and the adrenal gland.
No worrisome renal lesions or hydronephrosis.

Stomach/Bowel: The stomach, duodenum, visualized small bowel and
visualized colon are grossly normal.

Vascular/Lymphatic: No mesenteric or retroperitoneal mass or
adenopathy.

Other: No ascites.

Musculoskeletal: No significant bony findings.  Moderate scoliosis.
IMPRESSION: 1. No hepatic lesions or intrahepatic biliary dilatation. There is
moderate mass effect on the right hepatic lobe by a very large right
renal cyst.
2. Bilateral renal cysts.
3. 7 mm pancreatic body cyst.  Recommend 1 year followup.
4. No acute abdominal findings, mass lesions or adenopathy.

## 2017-01-16 DIAGNOSIS — H353221 Exudative age-related macular degeneration, left eye, with active choroidal neovascularization: Secondary | ICD-10-CM | POA: Diagnosis not present

## 2017-01-16 DIAGNOSIS — H04123 Dry eye syndrome of bilateral lacrimal glands: Secondary | ICD-10-CM | POA: Diagnosis not present

## 2017-01-16 DIAGNOSIS — Z961 Presence of intraocular lens: Secondary | ICD-10-CM | POA: Diagnosis not present

## 2017-01-16 DIAGNOSIS — H1851 Endothelial corneal dystrophy: Secondary | ICD-10-CM | POA: Diagnosis not present

## 2017-01-16 DIAGNOSIS — H353212 Exudative age-related macular degeneration, right eye, with inactive choroidal neovascularization: Secondary | ICD-10-CM | POA: Diagnosis not present

## 2017-02-09 DIAGNOSIS — Z4689 Encounter for fitting and adjustment of other specified devices: Secondary | ICD-10-CM | POA: Diagnosis not present

## 2017-02-13 DIAGNOSIS — H1851 Endothelial corneal dystrophy: Secondary | ICD-10-CM | POA: Diagnosis not present

## 2017-02-13 DIAGNOSIS — Z961 Presence of intraocular lens: Secondary | ICD-10-CM | POA: Diagnosis not present

## 2017-02-13 DIAGNOSIS — H353212 Exudative age-related macular degeneration, right eye, with inactive choroidal neovascularization: Secondary | ICD-10-CM | POA: Diagnosis not present

## 2017-02-13 DIAGNOSIS — H04123 Dry eye syndrome of bilateral lacrimal glands: Secondary | ICD-10-CM | POA: Diagnosis not present

## 2017-02-13 DIAGNOSIS — H353221 Exudative age-related macular degeneration, left eye, with active choroidal neovascularization: Secondary | ICD-10-CM | POA: Diagnosis not present

## 2017-03-31 DIAGNOSIS — H353221 Exudative age-related macular degeneration, left eye, with active choroidal neovascularization: Secondary | ICD-10-CM | POA: Diagnosis not present

## 2017-03-31 DIAGNOSIS — Z961 Presence of intraocular lens: Secondary | ICD-10-CM | POA: Diagnosis not present

## 2017-03-31 DIAGNOSIS — H04123 Dry eye syndrome of bilateral lacrimal glands: Secondary | ICD-10-CM | POA: Diagnosis not present

## 2017-03-31 DIAGNOSIS — H1851 Endothelial corneal dystrophy: Secondary | ICD-10-CM | POA: Diagnosis not present

## 2017-03-31 DIAGNOSIS — H353212 Exudative age-related macular degeneration, right eye, with inactive choroidal neovascularization: Secondary | ICD-10-CM | POA: Diagnosis not present

## 2017-05-03 DIAGNOSIS — H52223 Regular astigmatism, bilateral: Secondary | ICD-10-CM | POA: Diagnosis not present

## 2017-05-04 DIAGNOSIS — H353212 Exudative age-related macular degeneration, right eye, with inactive choroidal neovascularization: Secondary | ICD-10-CM | POA: Diagnosis not present

## 2017-05-04 DIAGNOSIS — H353221 Exudative age-related macular degeneration, left eye, with active choroidal neovascularization: Secondary | ICD-10-CM | POA: Diagnosis not present

## 2017-06-01 DIAGNOSIS — H353212 Exudative age-related macular degeneration, right eye, with inactive choroidal neovascularization: Secondary | ICD-10-CM | POA: Diagnosis not present

## 2017-06-01 DIAGNOSIS — H353221 Exudative age-related macular degeneration, left eye, with active choroidal neovascularization: Secondary | ICD-10-CM | POA: Diagnosis not present

## 2017-06-05 DIAGNOSIS — E669 Obesity, unspecified: Secondary | ICD-10-CM | POA: Diagnosis not present

## 2017-06-05 DIAGNOSIS — K219 Gastro-esophageal reflux disease without esophagitis: Secondary | ICD-10-CM | POA: Diagnosis not present

## 2017-06-05 DIAGNOSIS — Z79890 Hormone replacement therapy (postmenopausal): Secondary | ICD-10-CM | POA: Diagnosis not present

## 2017-06-05 DIAGNOSIS — J309 Allergic rhinitis, unspecified: Secondary | ICD-10-CM | POA: Diagnosis not present

## 2017-06-05 DIAGNOSIS — N898 Other specified noninflammatory disorders of vagina: Secondary | ICD-10-CM | POA: Diagnosis not present

## 2017-06-05 DIAGNOSIS — Z6830 Body mass index (BMI) 30.0-30.9, adult: Secondary | ICD-10-CM | POA: Diagnosis not present

## 2017-06-05 DIAGNOSIS — I10 Essential (primary) hypertension: Secondary | ICD-10-CM | POA: Diagnosis not present

## 2017-06-05 DIAGNOSIS — Z7722 Contact with and (suspected) exposure to environmental tobacco smoke (acute) (chronic): Secondary | ICD-10-CM | POA: Diagnosis not present

## 2017-06-05 DIAGNOSIS — R32 Unspecified urinary incontinence: Secondary | ICD-10-CM | POA: Diagnosis not present

## 2017-06-05 DIAGNOSIS — Z809 Family history of malignant neoplasm, unspecified: Secondary | ICD-10-CM | POA: Diagnosis not present

## 2017-06-08 DIAGNOSIS — Z4689 Encounter for fitting and adjustment of other specified devices: Secondary | ICD-10-CM | POA: Diagnosis not present

## 2017-07-03 DIAGNOSIS — E782 Mixed hyperlipidemia: Secondary | ICD-10-CM | POA: Diagnosis not present

## 2017-07-03 DIAGNOSIS — R7303 Prediabetes: Secondary | ICD-10-CM | POA: Diagnosis not present

## 2017-07-03 DIAGNOSIS — E663 Overweight: Secondary | ICD-10-CM | POA: Diagnosis not present

## 2017-07-03 DIAGNOSIS — Z6829 Body mass index (BMI) 29.0-29.9, adult: Secondary | ICD-10-CM | POA: Diagnosis not present

## 2017-07-03 DIAGNOSIS — K219 Gastro-esophageal reflux disease without esophagitis: Secondary | ICD-10-CM | POA: Diagnosis not present

## 2017-07-03 DIAGNOSIS — I10 Essential (primary) hypertension: Secondary | ICD-10-CM | POA: Diagnosis not present

## 2017-07-03 DIAGNOSIS — E042 Nontoxic multinodular goiter: Secondary | ICD-10-CM | POA: Diagnosis not present

## 2017-07-04 DIAGNOSIS — H04123 Dry eye syndrome of bilateral lacrimal glands: Secondary | ICD-10-CM | POA: Diagnosis not present

## 2017-07-04 DIAGNOSIS — H1851 Endothelial corneal dystrophy: Secondary | ICD-10-CM | POA: Diagnosis not present

## 2017-07-04 DIAGNOSIS — H353221 Exudative age-related macular degeneration, left eye, with active choroidal neovascularization: Secondary | ICD-10-CM | POA: Diagnosis not present

## 2017-07-04 DIAGNOSIS — H353212 Exudative age-related macular degeneration, right eye, with inactive choroidal neovascularization: Secondary | ICD-10-CM | POA: Diagnosis not present

## 2017-07-04 DIAGNOSIS — Z961 Presence of intraocular lens: Secondary | ICD-10-CM | POA: Diagnosis not present

## 2017-08-03 DIAGNOSIS — H353221 Exudative age-related macular degeneration, left eye, with active choroidal neovascularization: Secondary | ICD-10-CM | POA: Diagnosis not present

## 2017-08-03 DIAGNOSIS — H353212 Exudative age-related macular degeneration, right eye, with inactive choroidal neovascularization: Secondary | ICD-10-CM | POA: Diagnosis not present

## 2017-08-30 DIAGNOSIS — H43822 Vitreomacular adhesion, left eye: Secondary | ICD-10-CM | POA: Diagnosis not present

## 2017-08-30 DIAGNOSIS — H353132 Nonexudative age-related macular degeneration, bilateral, intermediate dry stage: Secondary | ICD-10-CM | POA: Diagnosis not present

## 2017-08-30 DIAGNOSIS — H353221 Exudative age-related macular degeneration, left eye, with active choroidal neovascularization: Secondary | ICD-10-CM | POA: Diagnosis not present

## 2017-08-30 DIAGNOSIS — H35352 Cystoid macular degeneration, left eye: Secondary | ICD-10-CM | POA: Diagnosis not present

## 2017-09-01 DIAGNOSIS — H43822 Vitreomacular adhesion, left eye: Secondary | ICD-10-CM | POA: Diagnosis not present

## 2017-09-01 DIAGNOSIS — H35352 Cystoid macular degeneration, left eye: Secondary | ICD-10-CM | POA: Diagnosis not present

## 2017-09-01 DIAGNOSIS — H353132 Nonexudative age-related macular degeneration, bilateral, intermediate dry stage: Secondary | ICD-10-CM | POA: Diagnosis not present

## 2017-09-01 DIAGNOSIS — H353221 Exudative age-related macular degeneration, left eye, with active choroidal neovascularization: Secondary | ICD-10-CM | POA: Diagnosis not present

## 2017-10-04 DIAGNOSIS — H353221 Exudative age-related macular degeneration, left eye, with active choroidal neovascularization: Secondary | ICD-10-CM | POA: Diagnosis not present

## 2017-10-04 DIAGNOSIS — H353132 Nonexudative age-related macular degeneration, bilateral, intermediate dry stage: Secondary | ICD-10-CM | POA: Diagnosis not present

## 2017-10-04 DIAGNOSIS — H43822 Vitreomacular adhesion, left eye: Secondary | ICD-10-CM | POA: Diagnosis not present

## 2017-10-04 DIAGNOSIS — H35352 Cystoid macular degeneration, left eye: Secondary | ICD-10-CM | POA: Diagnosis not present

## 2017-10-17 DIAGNOSIS — Z4689 Encounter for fitting and adjustment of other specified devices: Secondary | ICD-10-CM | POA: Diagnosis not present

## 2017-10-17 DIAGNOSIS — N952 Postmenopausal atrophic vaginitis: Secondary | ICD-10-CM | POA: Diagnosis not present

## 2017-10-17 DIAGNOSIS — N8111 Cystocele, midline: Secondary | ICD-10-CM | POA: Diagnosis not present

## 2017-10-17 DIAGNOSIS — Z01419 Encounter for gynecological examination (general) (routine) without abnormal findings: Secondary | ICD-10-CM | POA: Diagnosis not present

## 2017-11-08 DIAGNOSIS — H353132 Nonexudative age-related macular degeneration, bilateral, intermediate dry stage: Secondary | ICD-10-CM | POA: Diagnosis not present

## 2017-11-08 DIAGNOSIS — H353221 Exudative age-related macular degeneration, left eye, with active choroidal neovascularization: Secondary | ICD-10-CM | POA: Diagnosis not present

## 2017-11-08 DIAGNOSIS — H35352 Cystoid macular degeneration, left eye: Secondary | ICD-10-CM | POA: Diagnosis not present

## 2017-11-08 DIAGNOSIS — H43822 Vitreomacular adhesion, left eye: Secondary | ICD-10-CM | POA: Diagnosis not present

## 2017-12-26 DIAGNOSIS — H353132 Nonexudative age-related macular degeneration, bilateral, intermediate dry stage: Secondary | ICD-10-CM | POA: Diagnosis not present

## 2017-12-26 DIAGNOSIS — H43822 Vitreomacular adhesion, left eye: Secondary | ICD-10-CM | POA: Diagnosis not present

## 2017-12-26 DIAGNOSIS — H353221 Exudative age-related macular degeneration, left eye, with active choroidal neovascularization: Secondary | ICD-10-CM | POA: Diagnosis not present

## 2017-12-26 DIAGNOSIS — H35352 Cystoid macular degeneration, left eye: Secondary | ICD-10-CM | POA: Diagnosis not present

## 2018-01-02 DIAGNOSIS — Z1339 Encounter for screening examination for other mental health and behavioral disorders: Secondary | ICD-10-CM | POA: Diagnosis not present

## 2018-01-02 DIAGNOSIS — Z23 Encounter for immunization: Secondary | ICD-10-CM | POA: Diagnosis not present

## 2018-01-02 DIAGNOSIS — E663 Overweight: Secondary | ICD-10-CM | POA: Diagnosis not present

## 2018-01-02 DIAGNOSIS — I10 Essential (primary) hypertension: Secondary | ICD-10-CM | POA: Diagnosis not present

## 2018-01-02 DIAGNOSIS — Z1331 Encounter for screening for depression: Secondary | ICD-10-CM | POA: Diagnosis not present

## 2018-01-02 DIAGNOSIS — K219 Gastro-esophageal reflux disease without esophagitis: Secondary | ICD-10-CM | POA: Diagnosis not present

## 2018-01-02 DIAGNOSIS — Z9181 History of falling: Secondary | ICD-10-CM | POA: Diagnosis not present

## 2018-01-02 DIAGNOSIS — R7303 Prediabetes: Secondary | ICD-10-CM | POA: Diagnosis not present

## 2018-01-02 DIAGNOSIS — E042 Nontoxic multinodular goiter: Secondary | ICD-10-CM | POA: Diagnosis not present

## 2018-01-02 DIAGNOSIS — E782 Mixed hyperlipidemia: Secondary | ICD-10-CM | POA: Diagnosis not present

## 2018-01-17 DIAGNOSIS — D485 Neoplasm of uncertain behavior of skin: Secondary | ICD-10-CM | POA: Diagnosis not present

## 2018-01-17 DIAGNOSIS — C44329 Squamous cell carcinoma of skin of other parts of face: Secondary | ICD-10-CM | POA: Diagnosis not present

## 2018-01-17 DIAGNOSIS — Z23 Encounter for immunization: Secondary | ICD-10-CM | POA: Diagnosis not present

## 2018-02-12 DIAGNOSIS — C44329 Squamous cell carcinoma of skin of other parts of face: Secondary | ICD-10-CM | POA: Diagnosis not present

## 2018-02-13 DIAGNOSIS — H43822 Vitreomacular adhesion, left eye: Secondary | ICD-10-CM | POA: Diagnosis not present

## 2018-02-13 DIAGNOSIS — H353221 Exudative age-related macular degeneration, left eye, with active choroidal neovascularization: Secondary | ICD-10-CM | POA: Diagnosis not present

## 2018-02-13 DIAGNOSIS — H35352 Cystoid macular degeneration, left eye: Secondary | ICD-10-CM | POA: Diagnosis not present

## 2018-03-22 DIAGNOSIS — Z4689 Encounter for fitting and adjustment of other specified devices: Secondary | ICD-10-CM | POA: Diagnosis not present

## 2018-03-22 DIAGNOSIS — N8189 Other female genital prolapse: Secondary | ICD-10-CM | POA: Diagnosis not present

## 2018-03-22 DIAGNOSIS — N952 Postmenopausal atrophic vaginitis: Secondary | ICD-10-CM | POA: Diagnosis not present

## 2018-04-03 DIAGNOSIS — H35352 Cystoid macular degeneration, left eye: Secondary | ICD-10-CM | POA: Diagnosis not present

## 2018-04-03 DIAGNOSIS — H353221 Exudative age-related macular degeneration, left eye, with active choroidal neovascularization: Secondary | ICD-10-CM | POA: Diagnosis not present

## 2018-04-03 DIAGNOSIS — H43822 Vitreomacular adhesion, left eye: Secondary | ICD-10-CM | POA: Diagnosis not present

## 2018-05-25 DIAGNOSIS — H524 Presbyopia: Secondary | ICD-10-CM | POA: Diagnosis not present

## 2018-05-25 DIAGNOSIS — Z0100 Encounter for examination of eyes and vision without abnormal findings: Secondary | ICD-10-CM | POA: Diagnosis not present

## 2018-06-05 DIAGNOSIS — H43822 Vitreomacular adhesion, left eye: Secondary | ICD-10-CM | POA: Diagnosis not present

## 2018-06-05 DIAGNOSIS — H35352 Cystoid macular degeneration, left eye: Secondary | ICD-10-CM | POA: Diagnosis not present

## 2018-06-05 DIAGNOSIS — H353221 Exudative age-related macular degeneration, left eye, with active choroidal neovascularization: Secondary | ICD-10-CM | POA: Diagnosis not present

## 2018-06-05 DIAGNOSIS — H353132 Nonexudative age-related macular degeneration, bilateral, intermediate dry stage: Secondary | ICD-10-CM | POA: Diagnosis not present

## 2018-07-06 DIAGNOSIS — E782 Mixed hyperlipidemia: Secondary | ICD-10-CM | POA: Diagnosis not present

## 2018-07-06 DIAGNOSIS — E042 Nontoxic multinodular goiter: Secondary | ICD-10-CM | POA: Diagnosis not present

## 2018-07-06 DIAGNOSIS — Z1331 Encounter for screening for depression: Secondary | ICD-10-CM | POA: Diagnosis not present

## 2018-07-06 DIAGNOSIS — K219 Gastro-esophageal reflux disease without esophagitis: Secondary | ICD-10-CM | POA: Diagnosis not present

## 2018-07-06 DIAGNOSIS — R7303 Prediabetes: Secondary | ICD-10-CM | POA: Diagnosis not present

## 2018-07-06 DIAGNOSIS — E663 Overweight: Secondary | ICD-10-CM | POA: Diagnosis not present

## 2018-07-06 DIAGNOSIS — Z9181 History of falling: Secondary | ICD-10-CM | POA: Diagnosis not present

## 2018-07-06 DIAGNOSIS — I10 Essential (primary) hypertension: Secondary | ICD-10-CM | POA: Diagnosis not present

## 2018-07-24 DIAGNOSIS — Z4689 Encounter for fitting and adjustment of other specified devices: Secondary | ICD-10-CM | POA: Diagnosis not present

## 2018-07-24 DIAGNOSIS — N8111 Cystocele, midline: Secondary | ICD-10-CM | POA: Diagnosis not present

## 2018-07-24 DIAGNOSIS — Z79890 Hormone replacement therapy (postmenopausal): Secondary | ICD-10-CM | POA: Diagnosis not present

## 2018-08-07 DIAGNOSIS — H43811 Vitreous degeneration, right eye: Secondary | ICD-10-CM | POA: Diagnosis not present

## 2018-08-07 DIAGNOSIS — H353132 Nonexudative age-related macular degeneration, bilateral, intermediate dry stage: Secondary | ICD-10-CM | POA: Diagnosis not present

## 2018-08-07 DIAGNOSIS — H43821 Vitreomacular adhesion, right eye: Secondary | ICD-10-CM | POA: Diagnosis not present

## 2018-08-07 DIAGNOSIS — H353221 Exudative age-related macular degeneration, left eye, with active choroidal neovascularization: Secondary | ICD-10-CM | POA: Diagnosis not present

## 2018-08-07 DIAGNOSIS — H43822 Vitreomacular adhesion, left eye: Secondary | ICD-10-CM | POA: Diagnosis not present

## 2018-09-26 DIAGNOSIS — H353221 Exudative age-related macular degeneration, left eye, with active choroidal neovascularization: Secondary | ICD-10-CM | POA: Diagnosis not present

## 2018-09-26 DIAGNOSIS — H35352 Cystoid macular degeneration, left eye: Secondary | ICD-10-CM | POA: Diagnosis not present

## 2018-09-26 DIAGNOSIS — H43822 Vitreomacular adhesion, left eye: Secondary | ICD-10-CM | POA: Diagnosis not present

## 2018-11-06 DIAGNOSIS — H43822 Vitreomacular adhesion, left eye: Secondary | ICD-10-CM | POA: Diagnosis not present

## 2018-11-06 DIAGNOSIS — H35352 Cystoid macular degeneration, left eye: Secondary | ICD-10-CM | POA: Diagnosis not present

## 2018-11-06 DIAGNOSIS — H353221 Exudative age-related macular degeneration, left eye, with active choroidal neovascularization: Secondary | ICD-10-CM | POA: Diagnosis not present

## 2018-11-27 DIAGNOSIS — Z4689 Encounter for fitting and adjustment of other specified devices: Secondary | ICD-10-CM | POA: Diagnosis not present

## 2018-11-27 DIAGNOSIS — N8111 Cystocele, midline: Secondary | ICD-10-CM | POA: Diagnosis not present

## 2018-12-10 DIAGNOSIS — R69 Illness, unspecified: Secondary | ICD-10-CM | POA: Diagnosis not present

## 2019-01-02 DIAGNOSIS — H35352 Cystoid macular degeneration, left eye: Secondary | ICD-10-CM | POA: Diagnosis not present

## 2019-01-02 DIAGNOSIS — H43822 Vitreomacular adhesion, left eye: Secondary | ICD-10-CM | POA: Diagnosis not present

## 2019-01-02 DIAGNOSIS — H353221 Exudative age-related macular degeneration, left eye, with active choroidal neovascularization: Secondary | ICD-10-CM | POA: Diagnosis not present

## 2019-01-07 DIAGNOSIS — Z6828 Body mass index (BMI) 28.0-28.9, adult: Secondary | ICD-10-CM | POA: Diagnosis not present

## 2019-01-07 DIAGNOSIS — K219 Gastro-esophageal reflux disease without esophagitis: Secondary | ICD-10-CM | POA: Diagnosis not present

## 2019-01-07 DIAGNOSIS — R7303 Prediabetes: Secondary | ICD-10-CM | POA: Diagnosis not present

## 2019-01-07 DIAGNOSIS — Z7689 Persons encountering health services in other specified circumstances: Secondary | ICD-10-CM | POA: Diagnosis not present

## 2019-01-07 DIAGNOSIS — E782 Mixed hyperlipidemia: Secondary | ICD-10-CM | POA: Diagnosis not present

## 2019-01-07 DIAGNOSIS — I10 Essential (primary) hypertension: Secondary | ICD-10-CM | POA: Diagnosis not present

## 2019-01-07 DIAGNOSIS — E042 Nontoxic multinodular goiter: Secondary | ICD-10-CM | POA: Diagnosis not present

## 2019-01-30 DIAGNOSIS — N952 Postmenopausal atrophic vaginitis: Secondary | ICD-10-CM | POA: Diagnosis not present

## 2019-01-30 DIAGNOSIS — K219 Gastro-esophageal reflux disease without esophagitis: Secondary | ICD-10-CM | POA: Diagnosis not present

## 2019-01-30 DIAGNOSIS — R69 Illness, unspecified: Secondary | ICD-10-CM | POA: Diagnosis not present

## 2019-01-30 DIAGNOSIS — E785 Hyperlipidemia, unspecified: Secondary | ICD-10-CM | POA: Diagnosis not present

## 2019-01-30 DIAGNOSIS — H3530 Unspecified macular degeneration: Secondary | ICD-10-CM | POA: Diagnosis not present

## 2019-01-30 DIAGNOSIS — H547 Unspecified visual loss: Secondary | ICD-10-CM | POA: Diagnosis not present

## 2019-01-30 DIAGNOSIS — J309 Allergic rhinitis, unspecified: Secondary | ICD-10-CM | POA: Diagnosis not present

## 2019-01-30 DIAGNOSIS — I10 Essential (primary) hypertension: Secondary | ICD-10-CM | POA: Diagnosis not present

## 2019-01-30 DIAGNOSIS — G3184 Mild cognitive impairment, so stated: Secondary | ICD-10-CM | POA: Diagnosis not present

## 2019-01-30 DIAGNOSIS — Z008 Encounter for other general examination: Secondary | ICD-10-CM | POA: Diagnosis not present

## 2019-02-28 DIAGNOSIS — Z1331 Encounter for screening for depression: Secondary | ICD-10-CM | POA: Diagnosis not present

## 2019-02-28 DIAGNOSIS — E785 Hyperlipidemia, unspecified: Secondary | ICD-10-CM | POA: Diagnosis not present

## 2019-02-28 DIAGNOSIS — Z9181 History of falling: Secondary | ICD-10-CM | POA: Diagnosis not present

## 2019-02-28 DIAGNOSIS — Z0000 Encounter for general adult medical examination without abnormal findings: Secondary | ICD-10-CM | POA: Diagnosis not present

## 2019-02-28 DIAGNOSIS — Z139 Encounter for screening, unspecified: Secondary | ICD-10-CM | POA: Diagnosis not present

## 2019-03-12 DIAGNOSIS — H43822 Vitreomacular adhesion, left eye: Secondary | ICD-10-CM | POA: Diagnosis not present

## 2019-03-12 DIAGNOSIS — H353132 Nonexudative age-related macular degeneration, bilateral, intermediate dry stage: Secondary | ICD-10-CM | POA: Diagnosis not present

## 2019-03-12 DIAGNOSIS — H35352 Cystoid macular degeneration, left eye: Secondary | ICD-10-CM | POA: Diagnosis not present

## 2019-03-12 DIAGNOSIS — H353221 Exudative age-related macular degeneration, left eye, with active choroidal neovascularization: Secondary | ICD-10-CM | POA: Diagnosis not present

## 2019-04-04 DIAGNOSIS — Z1231 Encounter for screening mammogram for malignant neoplasm of breast: Secondary | ICD-10-CM | POA: Diagnosis not present

## 2019-04-04 DIAGNOSIS — Z4689 Encounter for fitting and adjustment of other specified devices: Secondary | ICD-10-CM | POA: Diagnosis not present

## 2019-05-21 DIAGNOSIS — H35352 Cystoid macular degeneration, left eye: Secondary | ICD-10-CM | POA: Diagnosis not present

## 2019-05-21 DIAGNOSIS — H43822 Vitreomacular adhesion, left eye: Secondary | ICD-10-CM | POA: Diagnosis not present

## 2019-05-21 DIAGNOSIS — H353221 Exudative age-related macular degeneration, left eye, with active choroidal neovascularization: Secondary | ICD-10-CM | POA: Diagnosis not present

## 2019-05-21 DIAGNOSIS — H35372 Puckering of macula, left eye: Secondary | ICD-10-CM | POA: Diagnosis not present

## 2019-05-27 DIAGNOSIS — H524 Presbyopia: Secondary | ICD-10-CM | POA: Diagnosis not present

## 2019-05-28 DIAGNOSIS — Z0100 Encounter for examination of eyes and vision without abnormal findings: Secondary | ICD-10-CM | POA: Diagnosis not present

## 2019-07-10 DIAGNOSIS — K219 Gastro-esophageal reflux disease without esophagitis: Secondary | ICD-10-CM | POA: Diagnosis not present

## 2019-07-10 DIAGNOSIS — Z6828 Body mass index (BMI) 28.0-28.9, adult: Secondary | ICD-10-CM | POA: Diagnosis not present

## 2019-07-10 DIAGNOSIS — R7303 Prediabetes: Secondary | ICD-10-CM | POA: Diagnosis not present

## 2019-07-10 DIAGNOSIS — E782 Mixed hyperlipidemia: Secondary | ICD-10-CM | POA: Diagnosis not present

## 2019-07-10 DIAGNOSIS — I10 Essential (primary) hypertension: Secondary | ICD-10-CM | POA: Diagnosis not present

## 2019-07-10 DIAGNOSIS — E042 Nontoxic multinodular goiter: Secondary | ICD-10-CM | POA: Diagnosis not present

## 2019-07-10 DIAGNOSIS — Z79899 Other long term (current) drug therapy: Secondary | ICD-10-CM | POA: Diagnosis not present

## 2019-07-15 DIAGNOSIS — H353221 Exudative age-related macular degeneration, left eye, with active choroidal neovascularization: Secondary | ICD-10-CM | POA: Insufficient documentation

## 2019-07-15 DIAGNOSIS — H35361 Drusen (degenerative) of macula, right eye: Secondary | ICD-10-CM | POA: Insufficient documentation

## 2019-07-15 DIAGNOSIS — Z961 Presence of intraocular lens: Secondary | ICD-10-CM | POA: Insufficient documentation

## 2019-07-15 DIAGNOSIS — H43821 Vitreomacular adhesion, right eye: Secondary | ICD-10-CM | POA: Insufficient documentation

## 2019-07-15 DIAGNOSIS — H35352 Cystoid macular degeneration, left eye: Secondary | ICD-10-CM | POA: Insufficient documentation

## 2019-07-15 DIAGNOSIS — H35362 Drusen (degenerative) of macula, left eye: Secondary | ICD-10-CM | POA: Insufficient documentation

## 2019-07-15 DIAGNOSIS — H43822 Vitreomacular adhesion, left eye: Secondary | ICD-10-CM | POA: Insufficient documentation

## 2019-07-15 DIAGNOSIS — H353132 Nonexudative age-related macular degeneration, bilateral, intermediate dry stage: Secondary | ICD-10-CM | POA: Insufficient documentation

## 2019-07-16 ENCOUNTER — Encounter (INDEPENDENT_AMBULATORY_CARE_PROVIDER_SITE_OTHER): Payer: Self-pay | Admitting: Ophthalmology

## 2019-07-16 ENCOUNTER — Other Ambulatory Visit: Payer: Self-pay

## 2019-07-16 ENCOUNTER — Ambulatory Visit (INDEPENDENT_AMBULATORY_CARE_PROVIDER_SITE_OTHER): Payer: Medicare HMO | Admitting: Ophthalmology

## 2019-07-16 DIAGNOSIS — H353221 Exudative age-related macular degeneration, left eye, with active choroidal neovascularization: Secondary | ICD-10-CM | POA: Diagnosis not present

## 2019-07-16 DIAGNOSIS — Z961 Presence of intraocular lens: Secondary | ICD-10-CM

## 2019-07-16 DIAGNOSIS — H43822 Vitreomacular adhesion, left eye: Secondary | ICD-10-CM | POA: Diagnosis not present

## 2019-07-16 DIAGNOSIS — H43821 Vitreomacular adhesion, right eye: Secondary | ICD-10-CM | POA: Diagnosis not present

## 2019-07-16 DIAGNOSIS — H35352 Cystoid macular degeneration, left eye: Secondary | ICD-10-CM

## 2019-07-16 DIAGNOSIS — H35361 Drusen (degenerative) of macula, right eye: Secondary | ICD-10-CM

## 2019-07-16 DIAGNOSIS — H353132 Nonexudative age-related macular degeneration, bilateral, intermediate dry stage: Secondary | ICD-10-CM

## 2019-07-16 DIAGNOSIS — H35362 Drusen (degenerative) of macula, left eye: Secondary | ICD-10-CM

## 2019-07-16 MED ORDER — BEVACIZUMAB CHEMO INJECTION 1.25MG/0.05ML SYRINGE FOR KALEIDOSCOPE
1.2500 mg | INTRAVITREAL | Status: AC | PRN
  Administered 2019-07-16: 11:00:00 1.25 mg via INTRAVITREAL

## 2019-07-16 NOTE — Assessment & Plan Note (Signed)
The nature of wet macular degeneration was discussed with the patient.  Forms of therapy reviewed include the use of Anti-VEGF medications injected painlessly into the eye, as well as other possible treatment modalities, including thermal laser therapy. Fellow eye involvement and risks were discussed with the patient. Upon the finding of wet age related macular degeneration, treatment will be offered. The treatment regimen is on a treat as needed basis with the intent to treat if necessary and extend interval of exams when possible. On average 1 out of 6 patients do not need lifetime therapy. However, the risk of recurrent disease is high for a lifetime.  Initially monthly, then periodic, examinations and evaluations will determine whether the next treatment is required on the day of the examination.  OS, stable at 8-week exam interval and repeat Avastin.  Some role of anatomic distortion is present from the vitreal macular traction syndrome

## 2019-07-16 NOTE — Progress Notes (Signed)
07/16/2019     CHIEF COMPLAINT Patient presents for Macular Degeneration and Retina Follow Up   HISTORY OF PRESENT ILLNESS: Deborah Solomon is a 84 y.o. female who presents to the clinic today for:   HPI    Retina Follow Up    Patient presents with  Wet AMD.  In left eye.  Duration of 8 weeks.  Since onset it is stable.          Comments    8 week follow up- OCT OU, Possible Avastin OS Patient denies change in vision and overall has no complaints.        Last edited by Hurman Horn, MD on 07/16/2019 10:35 AM. (History)      Referring physician: Hurman Horn, MD [place],  [place] [phone]  HISTORICAL INFORMATION:   Selected notes from the [place]: No current outpatient medications on file. (Ophthalmic Drugs)   No current facility-administered medications for this visit. (Ophthalmic Drugs)   Current Outpatient Medications (Other)  Medication Sig  . amLODipine (NORVASC) 5 MG tablet Take 5 mg by mouth daily.  . carvedilol (COREG) 25 MG tablet Take 25 mg by mouth 2 (two) times daily.  Marland Kitchen doxazosin (CARDURA) 8 MG tablet Take 12 mg by mouth at bedtime.  Marland Kitchen estradiol (ESTRACE) 0.1 MG/GM vaginal cream Place 1 g vaginally 2 (two) times a week.  . rosuvastatin (CRESTOR) 10 MG tablet Take 10 mg by mouth at bedtime.   No current facility-administered medications for this visit. (Other)      REVIEW OF SYSTEMS:    ALLERGIES No Known Allergies  PAST MEDICAL HISTORY Past Medical History:  Diagnosis Date  . Hypertension    Past Surgical History:  Procedure Laterality Date  . CATARACT EXTRACTION Bilateral 08/31/1998    FAMILY HISTORY History reviewed. No pertinent family history.  SOCIAL HISTORY Social History   Tobacco Use  . Smoking status: Never Smoker  . Smokeless tobacco: Never Used  Substance Use Topics  . Alcohol use: Not on file  . Drug use: Not on file         OPHTHALMIC EXAM:  Base  Eye Exam    Visual Acuity (Snellen - Linear)      Right Left   Dist cc 20/25-2 CF @ 2'   Dist ph cc  NI   Correction: Glasses       Tonometry (Tonopen, 9:14 AM)      Right Left   Pressure 9 8       Pupils      Pupils Dark Light Shape React APD   Right PERRL 3 2 Round Slow None   Left PERRL 3 2 Round Slow None       Visual Fields (Counting fingers)      Left Right    Full Full       Extraocular Movement      Right Left    Full Full       Neuro/Psych    Oriented x3: Yes   Mood/Affect: Normal       Dilation    Left eye: 2.5% Phenylephrine, 1.0% Mydriacyl @ 9:14 AM        Slit Lamp and Fundus Exam    External Exam      Right Left   External Normal Normal       Slit Lamp Exam      Right Left   Lids/Lashes  Normal Normal   Conjunctiva/Sclera White and quiet White and quiet   Cornea Clear Clear   Anterior Chamber Deep and quiet Deep and quiet   Iris Round and reactive Round and reactive   Lens Posterior chamber intraocular lens Posterior chamber intraocular lens   Anterior Vitreous Normal Normal       Fundus Exam      Right Left   Posterior Vitreous  Normal   Disc  Normal   C/D Ratio  0.35   Macula  Retinal pigment epithelial atrophy, Disciform scar, no hemorrhage   Vessels  Normal   Periphery  Normal          IMAGING AND PROCEDURES  Imaging and Procedures for 07/16/19  OCT, Retina - OU - Both Eyes       Right Eye Quality was good. Scan locations included subfoveal. Central Foveal Thickness: 214. Findings include normal observations, retinal drusen .   Left Eye Quality was good. Scan locations included subfoveal. Central Foveal Thickness: 442. Progression has been stable. Findings include retinal drusen , outer retinal atrophy, choroidal neovascular membrane, disciform scar, cystoid macular edema, intraretinal fluid, vitreous traction.   Notes OD, dry age-related maculopathy, stable no active CN VM  OS, with persistent intraretinal fluid,  cystoid macular edema on the basis of subfoveal disciform macular scar.  Notably vitreal macular traction and inner retinal elevation and possible schisis formation is present.  Surgical intervention could be undertaken for this however the outer retina and loss of the I-S-OS region suggest poor visual acuity prognosis with successful release of traction.       Intravitreal Injection, Pharmacologic Agent - OS - Left Eye       Time Out 07/16/2019. 10:38 AM. Confirmed correct patient, procedure, site, and patient consented.   Anesthesia Topical anesthesia was used. Anesthetic medications included Akten 3.5%.   Procedure Preparation included 10% betadine to eyelids, Tobramycin 0.3%.   Injection:  1.25 mg Bevacizumab (AVASTIN) SOLN   NDC: [id number], [id number]   Route: Intravitreal, Site: Left Eye, Waste: 0 mg  Post-op Post injection exam found visual acuity of at least counting fingers. The patient tolerated the procedure well. There were no complications. The patient received written and verbal post procedure care education. Post injection medications were not given.                 ASSESSMENT/PLAN:  Vitreomacular adhesion of left eye Vitreomacular traction may cause vision loss from anatomic distortion to the center of the vision, the macula.  If visual function is symptomatic or threatened, therapy may be needed.  Surgical intervention offers the highest chance of visual stability and improvement.  Distortion of the macula anatomy may cause splitting of the retinal layers, termed foveomacular retinoschisis, which can cause more permanent vision loss.  Epiretinal membranes may also be associated.  Macular hole may also develop if vitreomacular traction progresses. The minor form of this condition is Vitreomacular adhesion, which is a natural change in the aging process of the eye, which requires observation only.  OS likely with some role to the inadequate anatomic distortion.   Vitrectomy would potentially allow cessation of intravitreal Avastin.  Visual acuity outcome will be less certain.  Exudative age-related macular degeneration of left eye with active choroidal neovascularization (HCC) The nature of wet macular degeneration was discussed with the patient.  Forms of therapy reviewed include the use of Anti-VEGF medications injected painlessly into the eye, as well as other possible treatment modalities, including thermal laser therapy.  Fellow eye involvement and risks were discussed with the patient. Upon the finding of wet age related macular degeneration, treatment will be offered. The treatment regimen is on a treat as needed basis with the intent to treat if necessary and extend interval of exams when possible. On average 1 out of 6 patients do not need lifetime therapy. However, the risk of recurrent disease is high for a lifetime.  Initially monthly, then periodic, examinations and evaluations will determine whether the next treatment is required on the day of the examination.  OS, stable at 8-week exam interval and repeat Avastin.  Some role of anatomic distortion is present from the vitreal macular traction syndrome      ICD-10-CM   1. Exudative age-related macular degeneration of left eye with active choroidal neovascularization (HCC)  H35.3221 OCT, Retina - OU - Both Eyes    Intravitreal Injection, Pharmacologic Agent - OS - Left Eye    Bevacizumab (AVASTIN) SOLN 1.25 mg  2. Intermediate stage nonexudative age-related macular degeneration of both eyes  H35.3132   3. Cystoid macular edema of left eye  H35.352   4. Vitreomacular adhesion of right eye  H43.821   5. Vitreomacular adhesion of left eye  H43.822   6. Pseudophakia  Z96.1   7. Degenerative retinal drusen of right eye  H35.361   8. Degenerative retinal drusen of left eye  H35.362   9. Vitreomacular traction syndrome, left  H43.822 OCT, Retina - OU - Both Eyes    1.  2.  3.  Ophthalmic Meds  Ordered this visit:  Meds ordered this encounter  Medications  . Bevacizumab (AVASTIN) SOLN 1.25 mg       Return in about 8 weeks (around 09/10/2019) for DILATE OU, AVASTIN OCT, OS.  There are no Patient Instructions on file for this visit.   Explained the diagnoses, plan, and follow up with the patient and they expressed understanding.  Patient expressed understanding of the importance of proper follow up care.   Clent Demark Taym Twist M.D. Diseases & Surgery of the Retina and Vitreous [facility]ina [facility] Diabetic [place] 07/16/19     Abbreviations: M myopia (nearsighted); A astigmatism; H hyperopia (farsighted); P presbyopia; Mrx spectacle prescription;  CTL contact lenses; OD right eye; OS left eye; OU both eyes  XT exotropia; ET esotropia; PEK punctate epithelial keratitis; PEE punctate epithelial erosions; DES dry eye syndrome; MGD meibomian gland dysfunction; ATs artificial tears; PFAT's preservative free artificial tears; Arial nuclear sclerotic cataract; PSC posterior subcapsular cataract; ERM epi-retinal membrane; PVD posterior vitreous detachment; RD retinal detachment; DM diabetes mellitus; DR diabetic retinopathy; NPDR non-proliferative diabetic retinopathy; PDR proliferative diabetic retinopathy; CSME clinically significant macular edema; DME diabetic macular edema; dbh dot blot hemorrhages; CWS cotton wool spot; POAG primary open angle glaucoma; C/D cup-to-disc ratio; HVF humphrey visual field; GVF goldmann visual field; OCT optical coherence tomography; IOP intraocular pressure; BRVO Branch retinal vein occlusion; CRVO central retinal vein occlusion; CRAO central retinal artery occlusion; BRAO branch retinal artery occlusion; RT retinal tear; SB scleral buckle; PPV pars plana vitrectomy; VH Vitreous hemorrhage; PRP panretinal laser photocoagulation; IVK intravitreal kenalog; VMT vitreomacular traction; MH Macular hole;  NVD neovascularization of the disc; NVE neovascularization elsewhere; AREDS  age related eye disease study; ARMD age related macular degeneration; POAG primary open angle glaucoma; EBMD epithelial/anterior basement membrane dystrophy; ACIOL anterior chamber intraocular lens; IOL intraocular lens; PCIOL posterior chamber intraocular lens; Phaco/IOL phacoemulsification with intraocular lens placement; PRK photorefractive keratectomy; LASIK laser assisted in situ keratomileusis; HTN hypertension;  DM diabetes mellitus; COPD chronic obstructive pulmonary disease

## 2019-07-16 NOTE — Assessment & Plan Note (Signed)
Vitreomacular traction may cause vision loss from anatomic distortion to the center of the vision, the macula.  If visual function is symptomatic or threatened, therapy may be needed.  Surgical intervention offers the highest chance of visual stability and improvement.  Distortion of the macula anatomy may cause splitting of the retinal layers, termed foveomacular retinoschisis, which can cause more permanent vision loss.  Epiretinal membranes may also be associated.  Macular hole may also develop if vitreomacular traction progresses. The minor form of this condition is Vitreomacular adhesion, which is a natural change in the aging process of the eye, which requires observation only.  OS likely with some role to the inadequate anatomic distortion.  Vitrectomy would potentially allow cessation of intravitreal Avastin.  Visual acuity outcome will be less certain.

## 2019-08-21 DIAGNOSIS — Z4689 Encounter for fitting and adjustment of other specified devices: Secondary | ICD-10-CM | POA: Diagnosis not present

## 2019-09-10 ENCOUNTER — Other Ambulatory Visit: Payer: Self-pay

## 2019-09-10 ENCOUNTER — Ambulatory Visit (INDEPENDENT_AMBULATORY_CARE_PROVIDER_SITE_OTHER): Payer: Medicare HMO | Admitting: Ophthalmology

## 2019-09-10 ENCOUNTER — Encounter (INDEPENDENT_AMBULATORY_CARE_PROVIDER_SITE_OTHER): Payer: Self-pay | Admitting: Ophthalmology

## 2019-09-10 DIAGNOSIS — H353221 Exudative age-related macular degeneration, left eye, with active choroidal neovascularization: Secondary | ICD-10-CM | POA: Diagnosis not present

## 2019-09-10 MED ORDER — BEVACIZUMAB CHEMO INJECTION 1.25MG/0.05ML SYRINGE FOR KALEIDOSCOPE
1.2500 mg | INTRAVITREAL | Status: AC | PRN
  Administered 2019-09-10: 1.25 mg via INTRAVITREAL

## 2019-09-10 NOTE — Progress Notes (Signed)
09/10/2019     CHIEF COMPLAINT Patient presents for Retina Follow Up   HISTORY OF PRESENT ILLNESS: Deborah Solomon is a 84 y.o. female who presents to the clinic today for:   HPI    Retina Follow Up    Patient presents with  Wet AMD.  In left eye.  Duration of 8 weeks.  Since onset it is stable.          Comments    8 Week follow up - OCT OU, Poss Avastin OS Patient denies change in vision and overall has no complaints.        Last edited by Gerda Diss on 09/10/2019  9:15 AM. (History)      Referring physician: Practice, [place],  [place] [phone]  HISTORICAL INFORMATION:   Selected notes from the MEDICAL RECORD NUMBER       CURRENT MEDICATIONS: No current outpatient medications on file. (Ophthalmic Drugs)   No current facility-administered medications for this visit. (Ophthalmic Drugs)   Current Outpatient Medications (Other)  Medication Sig  . amLODipine (NORVASC) 5 MG tablet Take 5 mg by mouth daily.  . carvedilol (COREG) 25 MG tablet Take 25 mg by mouth 2 (two) times daily.  Marland Kitchen doxazosin (CARDURA) 8 MG tablet Take 12 mg by mouth at bedtime.  Marland Kitchen estradiol (ESTRACE) 0.1 MG/GM vaginal cream Place 1 g vaginally 2 (two) times a week.  . rosuvastatin (CRESTOR) 10 MG tablet Take 10 mg by mouth at bedtime.   No current facility-administered medications for this visit. (Other)      REVIEW OF SYSTEMS:    ALLERGIES No Known Allergies  PAST MEDICAL HISTORY Past Medical History:  Diagnosis Date  . Hypertension    Past Surgical History:  Procedure Laterality Date  . CATARACT EXTRACTION Bilateral 08/31/1998    FAMILY HISTORY History reviewed. No pertinent family history.  SOCIAL HISTORY Social History   Tobacco Use  . Smoking status: Never Smoker  . Smokeless tobacco: Never Used  Substance Use Topics  . Alcohol use: Not on file  . Drug use: Not on file         OPHTHALMIC EXAM:  Base Eye Exam     Visual Acuity (Snellen - Linear)      Right Left   Dist cc 20/25-2 CF @ 4'   Dist ph cc  NI       Tonometry (Tonopen, 9:22 AM)      Right Left   Pressure 12 11       Pupils      Pupils Dark Light Shape React APD   Right PERRL 3 2 Round Slow None   Left PERRL 3 2 Round Slow None       Visual Fields (Counting fingers)      Left Right    Full Full       Extraocular Movement      Right Left    Full Full       Neuro/Psych    Oriented x3: Yes   Mood/Affect: Normal        Slit Lamp and Fundus Exam    External Exam      Right Left   External Normal Normal       Slit Lamp Exam      Right Left   Lids/Lashes Normal Normal   Conjunctiva/Sclera White and quiet White and quiet   Cornea Clear Clear   Anterior Chamber Deep and quiet Deep and  quiet   Iris Round and reactive Round and reactive   Lens Posterior chamber intraocular lens Posterior chamber intraocular lens   Anterior Vitreous Normal Normal       Fundus Exam      Right Left   Posterior Vitreous  Normal   Disc  Normal   C/D Ratio  0.3   Macula  Retinal pigment epithelial atrophy, Disciform scar, no hemorrhage, Geographic atrophy, Cystoid macular edema   Vessels  Normal   Periphery  Normal          IMAGING AND PROCEDURES  Imaging and Procedures for 09/10/19  OCT, Retina - OU - Both Eyes       Right Eye Quality was good. Scan locations included subfoveal. Central Foveal Thickness: 217. Findings include retinal drusen , no IRF, no SRF.   Left Eye Quality was good. Scan locations included subfoveal. Central Foveal Thickness: 512. Progression has worsened. Findings include cystoid macular edema, subretinal hyper-reflective material, disciform scar, intraretinal fluid.   Notes OS, with worsening of intraretinal fluid and CME with subretinal scarring from disciform scar Slightly worse at 8-week interval.  We will repeat intravitreal Avastin OS today and examination in 7 weeks                  ASSESSMENT/PLAN:  Exudative age-related macular degeneration of left eye with active choroidal neovascularization (HCC) ARMD, wax with CNVM and intraretinal fluid and CME.  Condition worsened by examination and by OCT OS at 8-week exam interval.  We will repeat intravitreal Avastin OS today and examination in 7 weeks to monitor to prevent scotoma enlargement      ICD-10-CM   1. Exudative age-related macular degeneration of left eye with active choroidal neovascularization (HCC)  H35.3221 OCT, Retina - OU - Both Eyes    1.  2.  3.  Ophthalmic Meds Ordered this visit:  No orders of the defined types were placed in this encounter.      Return in about 7 weeks (around 10/29/2019) for dilate, OS, AVASTIN OCT.  There are no Patient Instructions on file for this visit.   Explained the diagnoses, plan, and follow up with the patient and they expressed understanding.  Patient expressed understanding of the importance of proper follow up care.   Clent Demark Seymour Pavlak M.D. Diseases & Surgery of the Retina and Vitreous [facility] [place] 09/10/19     Abbreviations: M myopia (nearsighted); A astigmatism; H hyperopia (farsighted); P presbyopia; Mrx spectacle prescription;  CTL contact lenses; OD right eye; OS left eye; OU both eyes  XT exotropia; ET esotropia; PEK punctate epithelial keratitis; PEE punctate epithelial erosions; DES dry eye syndrome; MGD meibomian gland dysfunction; ATs artificial tears; PFAT's preservative free artificial tears; [place] nuclear sclerotic cataract; PSC posterior subcapsular cataract; ERM epi-retinal membrane; PVD posterior vitreous detachment; RD retinal detachment; DM diabetes mellitus; DR diabetic retinopathy; NPDR non-proliferative diabetic retinopathy; PDR proliferative diabetic retinopathy; CSME clinically significant macular edema; DME diabetic macular edema; dbh dot blot hemorrhages; CWS cotton wool spot; POAG primary open angle glaucoma; C/D  cup-to-disc ratio; HVF humphrey visual field; GVF goldmann visual field; OCT optical coherence tomography; IOP intraocular pressure; BRVO Branch retinal vein occlusion; CRVO central retinal vein occlusion; CRAO central retinal artery occlusion; BRAO branch retinal artery occlusion; RT retinal tear; SB scleral buckle; PPV pars plana vitrectomy; VH Vitreous hemorrhage; PRP panretinal laser photocoagulation; IVK intravitreal kenalog; VMT vitreomacular traction; MH Macular hole;  NVD neovascularization of the disc; NVE neovascularization elsewhere; AREDS age related  eye disease study; ARMD age related macular degeneration; POAG primary open angle glaucoma; EBMD epithelial/anterior basement membrane dystrophy; ACIOL anterior chamber intraocular lens; IOL intraocular lens; PCIOL posterior chamber intraocular lens; Phaco/IOL phacoemulsification with intraocular lens placement; [place] photorefractive keratectomy; LASIK laser assisted in situ keratomileusis; HTN hypertension; DM diabetes mellitus; COPD chronic obstructive pulmonary disease

## 2019-09-10 NOTE — Assessment & Plan Note (Signed)
ARMD, wax with CNVM and intraretinal fluid and CME.  Condition worsened by examination and by OCT OS at 8-week exam interval.  We will repeat intravitreal Avastin OS today and examination in 7 weeks to monitor to prevent scotoma enlargement

## 2019-10-29 ENCOUNTER — Ambulatory Visit (INDEPENDENT_AMBULATORY_CARE_PROVIDER_SITE_OTHER): Payer: Medicare HMO | Admitting: Ophthalmology

## 2019-10-29 ENCOUNTER — Encounter (INDEPENDENT_AMBULATORY_CARE_PROVIDER_SITE_OTHER): Payer: Self-pay | Admitting: Ophthalmology

## 2019-10-29 ENCOUNTER — Encounter (INDEPENDENT_AMBULATORY_CARE_PROVIDER_SITE_OTHER): Payer: Medicare HMO | Admitting: Ophthalmology

## 2019-10-29 ENCOUNTER — Other Ambulatory Visit: Payer: Self-pay

## 2019-10-29 DIAGNOSIS — H353221 Exudative age-related macular degeneration, left eye, with active choroidal neovascularization: Secondary | ICD-10-CM

## 2019-10-29 MED ORDER — BEVACIZUMAB CHEMO INJECTION 1.25MG/0.05ML SYRINGE FOR KALEIDOSCOPE
1.2500 mg | INTRAVITREAL | Status: AC | PRN
  Administered 2019-10-29: 1.25 mg via INTRAVITREAL

## 2019-10-29 NOTE — Progress Notes (Signed)
10/29/2019     CHIEF COMPLAINT Patient presents for Retina Follow Up   HISTORY OF PRESENT ILLNESS: Deborah Solomon is a 84 y.o. female who presents to the clinic today for:   HPI    Retina Follow Up    Patient presents with  Wet AMD.  In left eye.  Severity is moderate.  Duration of 7 weeks.  Since onset it is stable.  I, the attending physician,  performed the HPI with the patient and updated documentation appropriately.          Comments    7 Week Wet AMD f\u OS. Possible Avastin OS. OCT  Pt states vision is stable. Pt feels vision is better on cloudy days.       Last edited by Tilda Franco on 10/29/2019  9:26 AM. (History)      Referring physician: Practice, [place],  [place] [phone]  HISTORICAL INFORMATION:   Selected notes from the MEDICAL RECORD NUMBER       CURRENT MEDICATIONS: No current outpatient medications on file. (Ophthalmic Drugs)   No current facility-administered medications for this visit. (Ophthalmic Drugs)   Current Outpatient Medications (Other)  Medication Sig  . amLODipine (NORVASC) 5 MG tablet Take 5 mg by mouth daily.  . carvedilol (COREG) 25 MG tablet Take 25 mg by mouth 2 (two) times daily.  Marland Kitchen doxazosin (CARDURA) 8 MG tablet Take 12 mg by mouth at bedtime.  Marland Kitchen estradiol (ESTRACE) 0.1 MG/GM vaginal cream Place 1 g vaginally 2 (two) times a week.  . rosuvastatin (CRESTOR) 10 MG tablet Take 10 mg by mouth at bedtime.   No current facility-administered medications for this visit. (Other)      REVIEW OF SYSTEMS:    ALLERGIES No Known Allergies  PAST MEDICAL HISTORY Past Medical History:  Diagnosis Date  . Hypertension    Past Surgical History:  Procedure Laterality Date  . CATARACT EXTRACTION Bilateral 08/31/1998    FAMILY HISTORY History reviewed. No pertinent family history.  SOCIAL HISTORY Social History   Tobacco Use  . Smoking status: Never Smoker  . Smokeless  tobacco: Never Used  Substance Use Topics  . Alcohol use: Not on file  . Drug use: Not on file         OPHTHALMIC EXAM: Base Eye Exam    Visual Acuity (Snellen - Linear)      Right Left   Dist cc 20/25 -1 CF @ 5'   Dist ph cc  NI       Tonometry (Tonopen, 9:30 AM)      Right Left   Pressure 8 6       Pupils      Pupils Dark Light Shape React APD   Right PERRL 3 2 Round Slow None   Left PERRL 3 2 Round Slow None       Visual Fields (Counting fingers)      Left Right    Full Full       Neuro/Psych    Oriented x3: Yes   Mood/Affect: Normal       Dilation    Left eye: 1.0% Mydriacyl, 2.5% Phenylephrine @ 9:30 AM        Slit Lamp and Fundus Exam    External Exam      Right Left   External Normal Normal       Slit Lamp Exam      Right Left   Lids/Lashes Normal Normal  Conjunctiva/Sclera White and quiet White and quiet   Cornea Clear Clear   Anterior Chamber Deep and quiet Deep and quiet   Iris Round and reactive Round and reactive   Lens Posterior chamber intraocular lens Posterior chamber intraocular lens   Anterior Vitreous Normal Normal       Fundus Exam      Right Left   Posterior Vitreous  Normal   Disc  Normal   C/D Ratio  0.3   Macula  Retinal pigment epithelial atrophy, Disciform scar, no hemorrhage, Geographic atrophy, Cystoid macular edema   Vessels  Normal   Periphery  Normal          IMAGING AND PROCEDURES  Imaging and Procedures for 10/29/19  OCT, Retina - OU - Both Eyes       Right Eye Quality was good. Scan locations included subfoveal. Central Foveal Thickness: 212. Progression has been stable. Findings include normal observations, no IRF, no SRF, retinal drusen .   Left Eye Quality was good. Scan locations included subfoveal. Central Foveal Thickness: 491. Progression has improved. Findings include cystoid macular edema, subretinal hyper-reflective material, abnormal foveal contour.        Intravitreal Injection,  Pharmacologic Agent - OS - Left Eye       Time Out 10/29/2019. 10:16 AM. Confirmed correct patient, procedure, site, and patient consented.   Anesthesia Topical anesthesia was used. Anesthetic medications included Akten 3.5%.   Procedure Preparation included Tobramycin 0.3%, Ofloxacin , 10% betadine to eyelids, 5% betadine to ocular surface. A supplied needle was used.   Injection:  1.25 mg Bevacizumab (AVASTIN) SOLN   NDC: [address]-8   Route: Intravitreal, Site: Left Eye, Waste: 0 mg  Post-op Post injection exam found visual acuity of at least counting fingers. The patient tolerated the procedure well. There were no complications. The patient received written and verbal post procedure care education. Post injection medications were not given.                 ASSESSMENT/PLAN:  Exudative age-related macular degeneration of left eye with active choroidal neovascularization (HCC) OS, stable disciform scar at 7-week interval post Avastin.  In fact much less perifoveal CME, will repeat injection today and exam in 7 weeks to minimize scotoma growth      ICD-10-CM   1. Exudative age-related macular degeneration of left eye with active choroidal neovascularization (HCC)  H35.3221 OCT, Retina - OU - Both Eyes    Intravitreal Injection, Pharmacologic Agent - OS - Left Eye    Bevacizumab (AVASTIN) SOLN 1.25 mg    1.  Patient to notify the office promptly should new difficulties arise with distorted vision in the right eye or change in vision in the left eye  2.  3.  Ophthalmic Meds Ordered this visit:  Meds ordered this encounter  Medications  . Bevacizumab (AVASTIN) SOLN 1.25 mg       Return in about 7 weeks (around 12/17/2019) for DILATE OU, AVASTIN OCT, OS.  There are no Patient Instructions on file for this visit.   Explained the diagnoses, plan, and follow up with the patient and they expressed understanding.  Patient expressed understanding of the importance of  proper follow up care.   Clent Demark Katherine Tout M.D. Diseases & Surgery of the Retina and Vitreous Retina [facility] Diabetic [place] 10/29/19     Abbreviations: M myopia (nearsighted); A astigmatism; H hyperopia (farsighted); P presbyopia; Mrx spectacle prescription;  CTL contact lenses; OD right eye; OS left eye; OU  both eyes  XT exotropia; ET esotropia; PEK punctate epithelial keratitis; PEE punctate epithelial erosions; DES dry eye syndrome; MGD meibomian gland dysfunction; ATs artificial tears; PFAT's preservative free artificial tears; [place] nuclear sclerotic cataract; PSC posterior subcapsular cataract; ERM epi-retinal membrane; PVD posterior vitreous detachment; RD retinal detachment; DM diabetes mellitus; DR diabetic retinopathy; NPDR non-proliferative diabetic retinopathy; PDR proliferative diabetic retinopathy; CSME clinically significant macular edema; DME diabetic macular edema; dbh dot blot hemorrhages; CWS cotton wool spot; POAG primary open angle glaucoma; C/D cup-to-disc ratio; HVF humphrey visual field; GVF goldmann visual field; OCT optical coherence tomography; IOP intraocular pressure; BRVO Branch retinal vein occlusion; CRVO central retinal vein occlusion; CRAO central retinal artery occlusion; BRAO branch retinal artery occlusion; RT retinal tear; SB scleral buckle; PPV pars plana vitrectomy; VH Vitreous hemorrhage; PRP panretinal laser photocoagulation; IVK intravitreal kenalog; VMT vitreomacular traction; MH Macular hole;  NVD neovascularization of the disc; NVE neovascularization elsewhere; AREDS age related eye disease study; ARMD age related macular degeneration; POAG primary open angle glaucoma; EBMD epithelial/anterior basement membrane dystrophy; ACIOL anterior chamber intraocular lens; IOL intraocular lens; PCIOL posterior chamber intraocular lens; Phaco/IOL phacoemulsification with intraocular lens placement; [place] photorefractive keratectomy; LASIK laser assisted in situ keratomileusis;  HTN hypertension; DM diabetes mellitus; COPD chronic obstructive pulmonary disease

## 2019-10-29 NOTE — Patient Instructions (Signed)
Age-Related Macular Degeneration  Age-related macular degeneration (AMD) is an eye disease related to aging. The disease causes a loss of central vision. Central vision allows a person to see objects clearly and do daily tasks like reading and driving. There are two main types of AMD:  Dry AMD. People with this type generally lose their vision slowly. This is the most common type of AMD. Some people with dry AMD notice very little change in their vision as they age.  Wet AMD. People with this type can lose their vision quickly. What are the causes? This condition is caused by damage to the part of the eye that provides you with central vision (macula).  Dry AMD happens when deposits in the macula cause light-sensitive cells to slowly break down.  Wet AMD happens when abnormal blood vessels grow under the macula and leak blood and fluid. What increases the risk? You are more likely to develop this condition if you:  Are 50 years old or older, and especially 75 years old or older.  Smoke.  Are obese.  Have a family history of AMD.  Have high cholesterol, high blood pressure, or heart disease.  Have been exposed to high levels of ultraviolet (UV) light and blue light.  Are white (Caucasian).  Are female. What are the signs or symptoms? Common symptoms of this condition include:  Blurred vision, especially when reading print material. The blurred vision often improves in brighter light.  A blurred or blind spot in the center of your field of vision that is small but growing larger.  Bright colors seeming less bright than they used to be.  Decreased ability to recognize and see faces.  One eye seeing worse than the other.  Decreased ability to adapt to dimly lit rooms.  Straight lines appearing crooked or wavy. How is this diagnosed? This condition is diagnosed based on your symptoms and an eye exam. During the eye exam:  Eye drops will be placed into your eyes to  enlarge (dilate) your pupils. This will allow your health care provider to see the back of your eye.  You may be asked to look at an image that looks like a checkerboard (Amsler grid). Early changes in your central vision may cause the grid to appear distorted. After the exam, you may be given one or both of these tests:  Fluorescein angiogram. This test determines whether you have dry or wet AMD.  Optical coherence tomography (OCT) test to evaluate deep layers of the retina. How is this treated? There is no cure for this condition, but treatment can help to slow down progression of the disease. This condition may be treated with:  Supplements, including vitamin C, vitamin E, beta carotene, and zinc.  Laser surgery to destroy new blood vessels or leaking blood vessels in your eye.  Injections of medicines into your eye to slow down the formation of abnormal blood vessels that may leak. These injections may need to be repeated on a routine basis. Follow these instructions at home:  Take over-the-counter and prescription medicines only as told by your health care provider.  Take vitamins and supplements as told by your health care provider.  Ask your health care provider for an Amsler grid. Use it every day to check each eye for vision changes.  Get an eye exam as often as told by your health care provider. Make sure to get an eye exam at least once every year.  Keep all follow-up visits as told by   your health care provider. This is important. Contact a health care provider if:  You notice any new changes in your vision. Get help right away if:  You suddenly lose vision or develop pain in the eye. Summary  Age-related macular degeneration (AMD) is an eye disease related to aging. There are two types of this condition: dry AMD and wet AMD.  This condition is caused by damage to the part of the eye that provides you with central vision (macula).  Once diagnosed with AMD, make sure  to get an eye exam every year, take supplements and vitamins as directed, use an Amsler grid at home, and follow up with your health care provider. This information is not intended to replace advice given to you by your health care provider. Make sure you discuss any questions you have with your health care provider. Document Revised: 09/06/2017 Document Reviewed: 09/06/2017 Elsevier Patient Education  2020 [employer]  

## 2019-10-29 NOTE — Assessment & Plan Note (Signed)
OS, stable disciform scar at 7-week interval post Avastin.  In fact much less perifoveal CME, will repeat injection today and exam in 7 weeks to minimize scotoma growth

## 2019-11-14 DIAGNOSIS — K219 Gastro-esophageal reflux disease without esophagitis: Secondary | ICD-10-CM | POA: Diagnosis not present

## 2019-11-14 DIAGNOSIS — E785 Hyperlipidemia, unspecified: Secondary | ICD-10-CM | POA: Diagnosis not present

## 2019-11-14 DIAGNOSIS — R69 Illness, unspecified: Secondary | ICD-10-CM | POA: Diagnosis not present

## 2019-11-14 DIAGNOSIS — H3530 Unspecified macular degeneration: Secondary | ICD-10-CM | POA: Diagnosis not present

## 2019-11-14 DIAGNOSIS — J309 Allergic rhinitis, unspecified: Secondary | ICD-10-CM | POA: Diagnosis not present

## 2019-11-14 DIAGNOSIS — I10 Essential (primary) hypertension: Secondary | ICD-10-CM | POA: Diagnosis not present

## 2019-11-14 DIAGNOSIS — K08409 Partial loss of teeth, unspecified cause, unspecified class: Secondary | ICD-10-CM | POA: Diagnosis not present

## 2019-11-14 DIAGNOSIS — E663 Overweight: Secondary | ICD-10-CM | POA: Diagnosis not present

## 2019-11-14 DIAGNOSIS — H547 Unspecified visual loss: Secondary | ICD-10-CM | POA: Diagnosis not present

## 2019-11-14 DIAGNOSIS — N952 Postmenopausal atrophic vaginitis: Secondary | ICD-10-CM | POA: Diagnosis not present

## 2019-11-25 DIAGNOSIS — R69 Illness, unspecified: Secondary | ICD-10-CM | POA: Diagnosis not present

## 2019-12-17 ENCOUNTER — Encounter (INDEPENDENT_AMBULATORY_CARE_PROVIDER_SITE_OTHER): Payer: Medicare HMO | Admitting: Ophthalmology

## 2019-12-17 ENCOUNTER — Ambulatory Visit (INDEPENDENT_AMBULATORY_CARE_PROVIDER_SITE_OTHER): Payer: Medicare HMO | Admitting: Ophthalmology

## 2019-12-17 ENCOUNTER — Other Ambulatory Visit: Payer: Self-pay

## 2019-12-17 ENCOUNTER — Encounter (INDEPENDENT_AMBULATORY_CARE_PROVIDER_SITE_OTHER): Payer: Self-pay | Admitting: Ophthalmology

## 2019-12-17 DIAGNOSIS — H353124 Nonexudative age-related macular degeneration, left eye, advanced atrophic with subfoveal involvement: Secondary | ICD-10-CM

## 2019-12-17 DIAGNOSIS — H353221 Exudative age-related macular degeneration, left eye, with active choroidal neovascularization: Secondary | ICD-10-CM

## 2019-12-17 MED ORDER — BEVACIZUMAB CHEMO INJECTION 1.25MG/0.05ML SYRINGE FOR KALEIDOSCOPE
1.2500 mg | INTRAVITREAL | Status: AC | PRN
Start: 2019-12-17 — End: 2019-12-17
  Administered 2019-12-17: 1.25 mg via INTRAVITREAL

## 2019-12-17 NOTE — Assessment & Plan Note (Signed)
Stable OS 

## 2019-12-17 NOTE — Assessment & Plan Note (Signed)
Like active CNVM yet no active edges, chronic CME overlying, stabilized on intravitreal Avastin currently at 7-week interval

## 2019-12-17 NOTE — Progress Notes (Signed)
12/17/2019     CHIEF COMPLAINT Patient presents for Retina Follow Up   HISTORY OF PRESENT ILLNESS: Deborah Solomon is a 84 y.o. female who presents to the clinic today for:   HPI    Retina Follow Up    Patient presents with  Wet AMD.  In left eye.  This started 7 weeks ago.  Severity is mild.  Duration of 7 weeks.  Since onset it is stable.          Comments    7 Week AMD F/U OU, poss Avastin OS  Pt denies noticeable changes to [place] OU since last visit. Pt denies ocular pain, flashes of light, or floaters OU.         Last edited by Rockie Neighbours, C[place] on 12/17/2019  9:19 AM. (History)      Referring physician: Practice, [place],  [place] [id number]  HISTORICAL INFORMATION:   Selected notes from the [place]: No current outpatient medications on file. (Ophthalmic Drugs)   No current facility-administered medications for this visit. (Ophthalmic Drugs)   Current Outpatient Medications (Other)  Medication Sig  . amLODipine (NORVASC) 5 MG tablet Take 5 mg by mouth daily.  . carvedilol (COREG) 25 MG tablet Take 25 mg by mouth 2 (two) times daily.  Marland Kitchen doxazosin (CARDURA) 8 MG tablet Take 12 mg by mouth at bedtime.  Marland Kitchen estradiol (ESTRACE) 0.1 MG/GM vaginal cream Place 1 g vaginally 2 (two) times a week.  . rosuvastatin (CRESTOR) 10 MG tablet Take 10 mg by mouth at bedtime.   No current facility-administered medications for this visit. (Other)      REVIEW OF SYSTEMS:    ALLERGIES No Known Allergies  PAST MEDICAL HISTORY Past Medical History:  Diagnosis Date  . Hypertension    Past Surgical History:  Procedure Laterality Date  . CATARACT EXTRACTION Bilateral 08/31/1998    FAMILY HISTORY History reviewed. No pertinent family history.  SOCIAL HISTORY Social History   Tobacco Use  . Smoking status: Never Smoker  . Smokeless tobacco: Never Used  Substance Use Topics  . Alcohol  use: Not on file  . Drug use: Not on file         OPHTHALMIC EXAM:  Base Eye Exam    Visual Acuity (ETDRS)      Right Left   Dist cc 20/20 -2 CF @ 5'   Dist ph cc  NI   Correction: Glasses       Tonometry (Tonopen, 9:19 AM)      Right Left   Pressure 05 08       Pupils      Pupils Dark Light Shape React APD   Right PERRL 3 2 Round Slow None   Left PERRL 3 2 Round Slow None       Visual Fields (Counting fingers)      Left Right    Full Full       Extraocular Movement      Right Left    Full Full       Neuro/Psych    Oriented x3: Yes   Mood/Affect: Normal       Dilation    Both eyes: 1.0% Mydriacyl, 2.5% Phenylephrine @ 9:22 AM        Slit Lamp and Fundus Exam    External Exam      Right Left   External Normal Normal  Slit Lamp Exam      Right Left   Lids/Lashes Normal Normal   Conjunctiva/Sclera White and quiet White and quiet   Cornea Clear Clear   Anterior Chamber Deep and quiet Deep and quiet   Iris Round and reactive Round and reactive   Lens Posterior chamber intraocular lens Posterior chamber intraocular lens   Anterior Vitreous Normal Normal       Fundus Exam      Right Left   Posterior Vitreous Normal Normal   Disc Normal Normal   C/D Ratio 0.4 0.3   Macula Geographic atrophy, Retinal pigment epithelial mottling Retinal pigment epithelial atrophy, Disciform scar, no hemorrhage, Geographic atrophy, Cystoid macular edema   Vessels Normal Normal   Periphery Normal Normal          IMAGING AND PROCEDURES  Imaging and Procedures for 12/17/19  OCT, Retina - OU - Both Eyes       Right Eye Quality was good. Scan locations included subfoveal. Central Foveal Thickness: 209. Progression has been stable. Findings include normal observations.   Left Eye Scan locations included subfoveal. Central Foveal Thickness: 484. Progression has been stable. Findings include cystoid macular edema, outer retinal atrophy, disciform scar,  subretinal fluid.   Notes OS, overall stable CNVM subfoveal, with lingering CME looks chronic.  No progression of the lesion size, no active edges repeat intravitreal Avastin today and examination again in 8 weeks       Intravitreal Injection, Pharmacologic Agent - OS - Left Eye       Time Out 12/17/2019. 10:42 AM. Confirmed correct patient, procedure, site, and patient consented.   Anesthesia Topical anesthesia was used. Anesthetic medications included Akten 3.5%.   Procedure Preparation included Tobramycin 0.3%, Ofloxacin , 10% betadine to eyelids, 5% betadine to ocular surface. A supplied needle was used.   Injection:  1.25 mg Bevacizumab (AVASTIN) SOLN   NDC: 70360-001-02, Lot: [id number]   Route: Intravitreal, Site: Left Eye, Waste: 0 mg  Post-op Post injection exam found visual acuity of at least counting fingers. The patient tolerated the procedure well. There were no complications. The patient received written and verbal post procedure care education. Post injection medications were not given.                 ASSESSMENT/PLAN:  Exudative age-related macular degeneration of left eye with active choroidal neovascularization (HCC) Like active CNVM yet no active edges, chronic CME overlying, stabilized on intravitreal Avastin currently at 7-week interval  Advanced nonexudative age-related macular degeneration of left eye with subfoveal involvement Stable OS      ICD-10-CM   1. Exudative age-related macular degeneration of left eye with active choroidal neovascularization (HCC)  H35.3221 OCT, Retina - OU - Both Eyes    Intravitreal Injection, Pharmacologic Agent - OS - Left Eye    Bevacizumab (AVASTIN) SOLN 1.25 mg  2. Advanced nonexudative age-related macular degeneration of left eye with subfoveal involvement  H35.3124     1.  Repeat intravitreal Avastin OS today and examination again in 9 weeks  2.  3.  Ophthalmic Meds Ordered this visit:  Meds ordered  this encounter  Medications  . Bevacizumab (AVASTIN) SOLN 1.25 mg       Return in about 9 weeks (around 02/18/2020) for dilate, OS, AVASTIN OCT.  There are no Patient Instructions on file for this visit.   Explained the diagnoses, plan, and follow up with the patient and they expressed understanding.  Patient expressed understanding of the importance of proper  follow up care.   Clent Demark Larosa Rhines M.D. Diseases & Surgery of the Retina and Vitreous [facility]ina [facility] Diabetic [place] 12/17/19     Abbreviations: M myopia (nearsighted); A astigmatism; H hyperopia (farsighted); P presbyopia; Mrx spectacle prescription;  CTL contact lenses; OD right eye; OS left eye; OU both eyes  XT exotropia; ET esotropia; PEK punctate epithelial keratitis; PEE punctate epithelial erosions; DES dry eye syndrome; MGD meibomian gland dysfunction; ATs artificial tears; PFAT's preservative free artificial tears; [place] nuclear sclerotic cataract; PSC posterior subcapsular cataract; ERM epi-retinal membrane; PVD posterior vitreous detachment; RD retinal detachment; DM diabetes mellitus; DR diabetic retinopathy; NPDR non-proliferative diabetic retinopathy; PDR proliferative diabetic retinopathy; CSME clinically significant macular edema; DME diabetic macular edema; dbh dot blot hemorrhages; CWS cotton wool spot; POAG primary open angle glaucoma; C/D cup-to-disc ratio; HVF humphrey visual field; GVF goldmann visual field; OCT optical coherence tomography; IOP intraocular pressure; BRVO Branch retinal vein occlusion; CRVO central retinal vein occlusion; CRAO central retinal artery occlusion; BRAO branch retinal artery occlusion; RT retinal tear; SB scleral buckle; PPV pars plana vitrectomy; VH Vitreous hemorrhage; PRP panretinal laser photocoagulation; IVK intravitreal kenalog; VMT vitreomacular traction; MH Macular hole;  NVD neovascularization of the disc; NVE neovascularization elsewhere; AREDS age related eye disease study; ARMD age  related macular degeneration; POAG primary open angle glaucoma; EBMD epithelial/anterior basement membrane dystrophy; ACIOL anterior chamber intraocular lens; IOL intraocular lens; PCIOL posterior chamber intraocular lens; Phaco/IOL phacoemulsification with intraocular lens placement; [place] photorefractive keratectomy; LASIK laser assisted in situ keratomileusis; HTN hypertension; DM diabetes mellitus; COPD chronic obstructive pulmonary disease

## 2020-01-09 DIAGNOSIS — Z6828 Body mass index (BMI) 28.0-28.9, adult: Secondary | ICD-10-CM | POA: Diagnosis not present

## 2020-01-09 DIAGNOSIS — Z79899 Other long term (current) drug therapy: Secondary | ICD-10-CM | POA: Diagnosis not present

## 2020-01-09 DIAGNOSIS — E782 Mixed hyperlipidemia: Secondary | ICD-10-CM | POA: Diagnosis not present

## 2020-01-09 DIAGNOSIS — I10 Essential (primary) hypertension: Secondary | ICD-10-CM | POA: Diagnosis not present

## 2020-01-09 DIAGNOSIS — K219 Gastro-esophageal reflux disease without esophagitis: Secondary | ICD-10-CM | POA: Diagnosis not present

## 2020-01-09 DIAGNOSIS — R7303 Prediabetes: Secondary | ICD-10-CM | POA: Diagnosis not present

## 2020-01-09 DIAGNOSIS — E042 Nontoxic multinodular goiter: Secondary | ICD-10-CM | POA: Diagnosis not present

## 2020-01-13 DIAGNOSIS — E782 Mixed hyperlipidemia: Secondary | ICD-10-CM | POA: Diagnosis not present

## 2020-01-13 DIAGNOSIS — E042 Nontoxic multinodular goiter: Secondary | ICD-10-CM | POA: Diagnosis not present

## 2020-01-13 DIAGNOSIS — R7303 Prediabetes: Secondary | ICD-10-CM | POA: Diagnosis not present

## 2020-01-13 DIAGNOSIS — Z79899 Other long term (current) drug therapy: Secondary | ICD-10-CM | POA: Diagnosis not present

## 2020-02-18 ENCOUNTER — Encounter (INDEPENDENT_AMBULATORY_CARE_PROVIDER_SITE_OTHER): Payer: Medicare HMO | Admitting: Ophthalmology

## 2020-02-19 ENCOUNTER — Encounter (INDEPENDENT_AMBULATORY_CARE_PROVIDER_SITE_OTHER): Payer: Self-pay | Admitting: Ophthalmology

## 2020-02-19 ENCOUNTER — Encounter (INDEPENDENT_AMBULATORY_CARE_PROVIDER_SITE_OTHER): Payer: Medicare HMO | Admitting: Ophthalmology

## 2020-02-19 ENCOUNTER — Ambulatory Visit (INDEPENDENT_AMBULATORY_CARE_PROVIDER_SITE_OTHER): Payer: Medicare HMO | Admitting: Ophthalmology

## 2020-02-19 ENCOUNTER — Other Ambulatory Visit: Payer: Self-pay

## 2020-02-19 DIAGNOSIS — H353221 Exudative age-related macular degeneration, left eye, with active choroidal neovascularization: Secondary | ICD-10-CM

## 2020-02-19 DIAGNOSIS — H35372 Puckering of macula, left eye: Secondary | ICD-10-CM | POA: Insufficient documentation

## 2020-02-19 MED ORDER — BEVACIZUMAB 2.5 MG/0.1ML IZ SOSY
2.5000 mg | PREFILLED_SYRINGE | INTRAVITREAL | Status: AC | PRN
  Administered 2020-02-19: 2.5 mg via INTRAVITREAL

## 2020-02-19 NOTE — Assessment & Plan Note (Signed)
Active OS with subretinal fluid and intraretinal fluid yet stabilized at current interval of 9 weeks.  Repeat intravitreal Avastin OS today

## 2020-02-19 NOTE — Progress Notes (Signed)
02/19/2020     CHIEF COMPLAINT Patient presents for Retina Follow Up   HISTORY OF PRESENT ILLNESS: Deborah Solomon is a 84 y.o. female who presents to the clinic today for:   HPI    Retina Follow Up    Patient presents with  Wet AMD.  In left eye.  Severity is moderate.  Duration of 9.  Since onset it is stable.  I, the attending physician,  performed the HPI with the patient and updated documentation appropriately.          Comments    9 Week Wet AMD f\u OS. Possible Avastin OS. OCT  Pt states she can tell it is time for another inj. Denies any new complaints.       Last edited by Tilda Franco on 02/19/2020  9:06 AM. (History)      Referring physician: Practice, [place],  [place] [phone]  HISTORICAL INFORMATION:   Selected notes from the MEDICAL RECORD NUMBER       CURRENT MEDICATIONS: No current outpatient medications on file. (Ophthalmic Drugs)   No current facility-administered medications for this visit. (Ophthalmic Drugs)   Current Outpatient Medications (Other)  Medication Sig  . amLODipine (NORVASC) 5 MG tablet Take 5 mg by mouth daily.  . carvedilol (COREG) 25 MG tablet Take 25 mg by mouth 2 (two) times daily.  Marland Kitchen doxazosin (CARDURA) 8 MG tablet Take 12 mg by mouth at bedtime.  Marland Kitchen estradiol (ESTRACE) 0.1 MG/GM vaginal cream Place 1 g vaginally 2 (two) times a week.  . rosuvastatin (CRESTOR) 10 MG tablet Take 10 mg by mouth at bedtime.   No current facility-administered medications for this visit. (Other)      REVIEW OF SYSTEMS:    ALLERGIES No Known Allergies  PAST MEDICAL HISTORY Past Medical History:  Diagnosis Date  . Hypertension    Past Surgical History:  Procedure Laterality Date  . CATARACT EXTRACTION Bilateral 08/31/1998    FAMILY HISTORY History reviewed. No pertinent family history.  SOCIAL HISTORY Social History   Tobacco Use  . Smoking status: Never Smoker  . Smokeless  tobacco: Never Used  Substance Use Topics  . Alcohol use: Not on file  . Drug use: Not on file         OPHTHALMIC EXAM: Base Eye Exam    Visual Acuity (Snellen - Linear)      Right Left   Dist cc 20/25 + CF @ 4'   Dist ph cc  NI       Tonometry (Tonopen, 9:11 AM)      Right Left   Pressure 10 9       Pupils      Pupils Dark Light Shape React APD   Right PERRL 3 2 Round Brisk None   Left PERRL 3 2 Round Brisk None       Visual Fields (Counting fingers)      Left Right    Full Full       Neuro/Psych    Oriented x3: Yes   Mood/Affect: Normal       Dilation    Left eye: 1.0% Mydriacyl, 2.5% Phenylephrine @ 9:11 AM        Slit Lamp and Fundus Exam    External Exam      Right Left   External Normal Normal       Slit Lamp Exam      Right Left   Lids/Lashes Normal  Normal   Conjunctiva/Sclera White and quiet White and quiet   Cornea Clear Clear   Anterior Chamber Deep and quiet Deep and quiet   Iris Round and reactive Round and reactive   Lens Posterior chamber intraocular lens Posterior chamber intraocular lens   Anterior Vitreous Normal Normal       Fundus Exam      Right Left   Posterior Vitreous  Normal   Disc  Normal   C/D Ratio  0.3   Macula  Retinal pigment epithelial atrophy, Disciform scar, no hemorrhage, Geographic atrophy, Cystoid macular edema   Vessels  Normal   Periphery  Normal          IMAGING AND PROCEDURES  Imaging and Procedures for 02/19/20  OCT, Retina - OU - Both Eyes       Right Eye Quality was good. Scan locations included subfoveal. Central Foveal Thickness: 209. Findings include vitreomacular adhesion , retinal drusen , abnormal foveal contour.   Left Eye Quality was good. Scan locations included subfoveal. Central Foveal Thickness: 476. Findings include subretinal scarring, cystoid macular edema, epiretinal membrane.   Notes OS, minor subretinal fluid yet intraretinal fluid remains largely from CNVM, some  component of epiretinal membrane OS  Retinal scarring limits potential for visual acuity For any mechanism of therapy including not a candidate good candidate for vitrectomy membrane peel for epiretinal membrane       Intravitreal Injection, Pharmacologic Agent - OS - Left Eye       Time Out 02/19/2020. 10:27 AM. Confirmed correct patient, procedure, site, and patient consented.   Anesthesia Topical anesthesia was used. Anesthetic medications included Akten 3.5%.   Procedure Preparation included Tobramycin 0.3%, Ofloxacin , 10% betadine to eyelids, 5% betadine to ocular surface. A supplied needle was used.   Injection:  2.5 mg Bevacizumab (AVASTIN) 2.5mg /0.8mL SOSY   NDC: 62836-629-47, Lot: [id number]   Route: Intravitreal, Site: Left Eye  Post-op Post injection exam found visual acuity of at least counting fingers. The patient tolerated the procedure well. There were no complications. The patient received written and verbal post procedure care education. Post injection medications were not given.                 ASSESSMENT/PLAN:  Exudative age-related macular degeneration of left eye with active choroidal neovascularization (HCC) Active OS with subretinal fluid and intraretinal fluid yet stabilized at current interval of 9 weeks.  Repeat intravitreal Avastin OS today  Macular pucker, left eye Contributing to CME, yet not good candidate for vitrectomy membrane peel due to outer retinal scarring limiting acuity      ICD-10-CM   1. Exudative age-related macular degeneration of left eye with active choroidal neovascularization (HCC)  H35.3221 OCT, Retina - OU - Both Eyes    Intravitreal Injection, Pharmacologic Agent - OS - Left Eye    bevacizumab (AVASTIN) SOSY 2.5 mg  2. Macular pucker, left eye  H35.372     1.  Patient instructed to contact the office promptly for new onset visual acuity decline or distortions  2.  Repeat intravitreal Avastin OS today to prevent  enlargement of scotoma from subfoveal CNVM.  Repeat emanation in 9 weeks  3.  Ophthalmic Meds Ordered this visit:  Meds ordered this encounter  Medications  . bevacizumab (AVASTIN) SOSY 2.5 mg       Return in about 9 weeks (around 04/22/2020) for dilate, OS, AVASTIN OCT.  There are no Patient Instructions on file for this visit.   Explained the  diagnoses, plan, and follow up with the patient and they expressed understanding.  Patient expressed understanding of the importance of proper follow up care.   Clent Demark Carthel Castille M.D. Diseases & Surgery of the Retina and Vitreous Retina [facility] Diabetic [place] 02/19/20     Abbreviations: M myopia (nearsighted); A astigmatism; H hyperopia (farsighted); P presbyopia; Mrx spectacle prescription;  CTL contact lenses; OD right eye; OS left eye; OU both eyes  XT exotropia; ET esotropia; PEK punctate epithelial keratitis; PEE punctate epithelial erosions; DES dry eye syndrome; MGD meibomian gland dysfunction; ATs artificial tears; PFAT's preservative free artificial tears; [place] nuclear sclerotic cataract; PSC posterior subcapsular cataract; ERM epi-retinal membrane; PVD posterior vitreous detachment; RD retinal detachment; DM diabetes mellitus; DR diabetic retinopathy; NPDR non-proliferative diabetic retinopathy; PDR proliferative diabetic retinopathy; CSME clinically significant macular edema; DME diabetic macular edema; dbh dot blot hemorrhages; CWS cotton wool spot; POAG primary open angle glaucoma; C/D cup-to-disc ratio; HVF humphrey visual field; GVF goldmann visual field; OCT optical coherence tomography; IOP intraocular pressure; BRVO Branch retinal vein occlusion; CRVO central retinal vein occlusion; CRAO central retinal artery occlusion; BRAO branch retinal artery occlusion; RT retinal tear; SB scleral buckle; PPV pars plana vitrectomy; VH Vitreous hemorrhage; PRP panretinal laser photocoagulation; IVK intravitreal kenalog; VMT vitreomacular traction; MH  Macular hole;  NVD neovascularization of the disc; NVE neovascularization elsewhere; AREDS age related eye disease study; ARMD age related macular degeneration; POAG primary open angle glaucoma; EBMD epithelial/anterior basement membrane dystrophy; ACIOL anterior chamber intraocular lens; IOL intraocular lens; PCIOL posterior chamber intraocular lens; Phaco/IOL phacoemulsification with intraocular lens placement; [place] photorefractive keratectomy; LASIK laser assisted in situ keratomileusis; HTN hypertension; DM diabetes mellitus; COPD chronic obstructive pulmonary disease

## 2020-02-19 NOTE — Assessment & Plan Note (Signed)
Contributing to CME, yet not good candidate for vitrectomy membrane peel due to outer retinal scarring limiting acuity

## 2020-02-25 DIAGNOSIS — N8111 Cystocele, midline: Secondary | ICD-10-CM | POA: Diagnosis not present

## 2020-02-25 DIAGNOSIS — Z4689 Encounter for fitting and adjustment of other specified devices: Secondary | ICD-10-CM | POA: Diagnosis not present

## 2020-03-02 DIAGNOSIS — Z9181 History of falling: Secondary | ICD-10-CM | POA: Diagnosis not present

## 2020-03-02 DIAGNOSIS — E785 Hyperlipidemia, unspecified: Secondary | ICD-10-CM | POA: Diagnosis not present

## 2020-03-02 DIAGNOSIS — Z1331 Encounter for screening for depression: Secondary | ICD-10-CM | POA: Diagnosis not present

## 2020-03-02 DIAGNOSIS — Z0000 Encounter for general adult medical examination without abnormal findings: Secondary | ICD-10-CM | POA: Diagnosis not present

## 2020-04-22 ENCOUNTER — Encounter (INDEPENDENT_AMBULATORY_CARE_PROVIDER_SITE_OTHER): Payer: Self-pay | Admitting: Ophthalmology

## 2020-04-22 ENCOUNTER — Ambulatory Visit (INDEPENDENT_AMBULATORY_CARE_PROVIDER_SITE_OTHER): Payer: Medicare HMO | Admitting: Ophthalmology

## 2020-04-22 ENCOUNTER — Other Ambulatory Visit: Payer: Self-pay

## 2020-04-22 DIAGNOSIS — H353132 Nonexudative age-related macular degeneration, bilateral, intermediate dry stage: Secondary | ICD-10-CM | POA: Diagnosis not present

## 2020-04-22 DIAGNOSIS — H353221 Exudative age-related macular degeneration, left eye, with active choroidal neovascularization: Secondary | ICD-10-CM

## 2020-04-22 DIAGNOSIS — H35372 Puckering of macula, left eye: Secondary | ICD-10-CM | POA: Diagnosis not present

## 2020-04-22 DIAGNOSIS — H43822 Vitreomacular adhesion, left eye: Secondary | ICD-10-CM | POA: Diagnosis not present

## 2020-04-22 DIAGNOSIS — H353124 Nonexudative age-related macular degeneration, left eye, advanced atrophic with subfoveal involvement: Secondary | ICD-10-CM

## 2020-04-22 MED ORDER — BEVACIZUMAB 2.5 MG/0.1ML IZ SOSY
2.5000 mg | PREFILLED_SYRINGE | INTRAVITREAL | Status: AC | PRN
  Administered 2020-04-22: 2.5 mg via INTRAVITREAL

## 2020-04-22 NOTE — Assessment & Plan Note (Signed)
Extensive drusen deposit deposition OU, yet no signs of CNVM OD

## 2020-04-22 NOTE — Assessment & Plan Note (Signed)
At 9-week follow-up interval today, no cysts size enlargement by clinical examination nor OCT.  Goal is to prevent scotoma enlargement OS.

## 2020-04-22 NOTE — Assessment & Plan Note (Signed)
Addition to disciform scar accounts for acuity OS

## 2020-04-22 NOTE — Assessment & Plan Note (Signed)
With subfoveal disciform scar, not visually significant

## 2020-04-22 NOTE — Assessment & Plan Note (Signed)
Superior aspect of the macula, with vitreomacular traction contributing to the serous elevation

## 2020-04-22 NOTE — Progress Notes (Signed)
04/22/2020     CHIEF COMPLAINT Patient presents for Retina Follow Up (55 WK FU OS, POSS AVASTIN OS///Pt reports stable vision OU. Pt denies any new F/F, pain, or pressure OU. )   HISTORY OF PRESENT ILLNESS: Deborah Solomon is a 85 y.o. female who presents to the clinic today for:   HPI    Retina Follow Up    Patient presents with  Wet AMD.  In left eye.  This started 9 weeks ago.  Duration of 9 weeks. Additional comments: 9 WK FU OS, POSS AVASTIN OS   Pt reports stable vision OU. Pt denies any new F/F, pain, or pressure OU.        Last edited by Nichola Sizer D on 04/22/2020  8:57 AM. (History)      Referring physician: Practice, [place],  [place] [phone]  HISTORICAL INFORMATION:   Selected notes from the [facility]: No current outpatient medications on file. (Ophthalmic Drugs)   No current facility-administered medications for this visit. (Ophthalmic Drugs)   Current Outpatient Medications (Other)  Medication Sig   amLODipine (NORVASC) 5 MG tablet Take 5 mg by mouth daily.   carvedilol (COREG) 25 MG tablet Take 25 mg by mouth 2 (two) times daily.   doxazosin (CARDURA) 8 MG tablet Take 12 mg by mouth at bedtime.   estradiol (ESTRACE) 0.1 MG/GM vaginal cream Place 1 g vaginally 2 (two) times a week.   rosuvastatin (CRESTOR) 10 MG tablet Take 10 mg by mouth at bedtime.   No current facility-administered medications for this visit. (Other)      REVIEW OF SYSTEMS:    ALLERGIES No Known Allergies  PAST MEDICAL HISTORY Past Medical History:  Diagnosis Date   Hypertension    Past Surgical History:  Procedure Laterality Date   CATARACT EXTRACTION Bilateral 08/31/1998    FAMILY HISTORY History reviewed. No pertinent family history.  SOCIAL HISTORY Social History   Tobacco Use   Smoking status: Never Smoker   Smokeless tobacco: Never Used         OPHTHALMIC  EXAM: Base Eye Exam    Visual Acuity (ETDRS)      Right Left   Dist cc 20/30 -2 CF at 3'   Dist ph cc NI    Correction: Glasses       Tonometry (Tonopen, 9:01 AM)      Right Left   Pressure 10 9       Pupils      Pupils Dark Light Shape React APD   Right PERRL 3 2 Round Brisk None   Left PERRL 3 2 Round Brisk None       Visual Fields (Counting fingers)      Left Right    Full Full       Extraocular Movement      Right Left    Full Full       Neuro/Psych    Oriented x3: Yes   Mood/Affect: Normal       Dilation    Left eye: 1.0% Mydriacyl, 2.5% Phenylephrine @ 9:01 AM        Slit Lamp and Fundus Exam    External Exam      Right Left   External Normal Normal       Slit Lamp Exam      Right Left   Lids/Lashes Normal Normal   Conjunctiva/Sclera White and quiet  White and quiet   Cornea Clear Clear   Anterior Chamber Deep and quiet Deep and quiet   Iris Round and reactive Round and reactive   Lens Posterior chamber intraocular lens Posterior chamber intraocular lens   Anterior Vitreous Normal Normal       Fundus Exam      Right Left   Posterior Vitreous  Normal   Disc  Normal   C/D Ratio  0.3   Macula  Retinal pigment epithelial atrophy, Disciform scar, no hemorrhage, Geographic atrophy, Cystoid macular edema   Vessels  Normal   Periphery  Normal          IMAGING AND PROCEDURES  Imaging and Procedures for 04/22/20  OCT, Retina - OU - Both Eyes       Right Eye Quality was good. Scan locations included subfoveal. Central Foveal Thickness: 216. Progression has been stable. Findings include abnormal foveal contour, retinal drusen , no SRF, no IRF.   Left Eye Quality was good. Central Foveal Thickness: 479. Progression has been stable. Findings include abnormal foveal contour, subretinal hyper-reflective material, subretinal fluid, intraretinal fluid, disciform scar, subretinal scarring.   Notes No signs of CNVM OD  OS with persistent chronic  intraretinal fluid and subretinal fluid overlying disciform macular scar, no interval change, since last injection 9 weeks previously.         Intravitreal Injection, Pharmacologic Agent - OS - Left Eye       Time Out 04/22/2020. 10:16 AM. Confirmed correct patient, procedure, site, and patient consented.   Anesthesia Topical anesthesia was used. Anesthetic medications included Akten 3.5%.   Procedure Preparation included Tobramycin 0.3%, Ofloxacin , 10% betadine to eyelids, 5% betadine to ocular surface. A supplied needle was used.   Injection:  2.5 mg Bevacizumab (AVASTIN) 2.5mg /0.80mL SOSY   NDC: 06301-601-09, Lot: [id number]   Route: Intravitreal, Site: Left Eye  Post-op Post injection exam found visual acuity of at least counting fingers. The patient tolerated the procedure well. There were no complications. The patient received written and verbal post procedure care education. Post injection medications were not given.                 ASSESSMENT/PLAN:  Advanced nonexudative age-related macular degeneration of left eye with subfoveal involvement Addition to disciform scar accounts for acuity OS  Exudative age-related macular degeneration of left eye with active choroidal neovascularization (HCC) At 9-week follow-up interval today, no cysts size enlargement by clinical examination nor OCT.  Goal is to prevent scotoma enlargement OS.  Vitreomacular adhesion of left eye Superior aspect of the macula, with vitreomacular traction contributing to the serous elevation  Macular pucker, left eye With subfoveal disciform scar, not visually significant  Intermediate stage nonexudative age-related macular degeneration of both eyes Extensive drusen deposit deposition OU, yet no signs of CNVM OD      ICD-10-CM   1. Exudative age-related macular degeneration of left eye with active choroidal neovascularization (HCC)  H35.3221 OCT, Retina - OU - Both Eyes    Intravitreal  Injection, Pharmacologic Agent - OS - Left Eye    bevacizumab (AVASTIN) SOSY 2.5 mg  2. Advanced nonexudative age-related macular degeneration of left eye with subfoveal involvement  H35.3124   3. Vitreomacular adhesion of left eye  H43.822   4. Macular pucker, left eye  H35.372   5. Intermediate stage nonexudative age-related macular degeneration of both eyes  H35.3132     1.  OS, successful prevention of scotoma growth by intravitreal Avastin, last visit  9 weeks previous repeat today  2.  OD no signs of active CNVM  3.  Vitreomacular adhesion contributing some component of the subfoveal serous elevation superior portion of the macula yet not worth surgical intervention  Ophthalmic Meds Ordered this visit:  Meds ordered this encounter  Medications   bevacizumab (AVASTIN) SOSY 2.5 mg       Return in about 3 months (around 07/20/2020) for DILATE OU, AVASTIN OCT, OS.  There are no Patient Instructions on file for this visit.   Explained the diagnoses, plan, and follow up with the patient and they expressed understanding.  Patient expressed understanding of the importance of proper follow up care.   Clent Demark Enijah Furr M.D. Diseases & Surgery of the Retina and Vitreous Retina & Diabetic [place] 04/22/20     Abbreviations: M myopia (nearsighted); A astigmatism; H hyperopia (farsighted); P presbyopia; Mrx spectacle prescription;  CTL contact lenses; OD right eye; OS left eye; OU both eyes  XT exotropia; ET esotropia; PEK punctate epithelial keratitis; PEE punctate epithelial erosions; DES dry eye syndrome; MGD meibomian gland dysfunction; ATs artificial tears; PFAT's preservative free artificial tears; [place] nuclear sclerotic cataract; PSC posterior subcapsular cataract; ERM epi-retinal membrane; PVD posterior vitreous detachment; RD retinal detachment; DM diabetes mellitus; DR diabetic retinopathy; NPDR non-proliferative diabetic retinopathy; PDR proliferative diabetic retinopathy; CSME  clinically significant macular edema; DME diabetic macular edema; dbh dot blot hemorrhages; CWS cotton wool spot; POAG primary open angle glaucoma; C/D cup-to-disc ratio; HVF humphrey visual field; GVF goldmann visual field; OCT optical coherence tomography; IOP intraocular pressure; BRVO Branch retinal vein occlusion; CRVO central retinal vein occlusion; CRAO central retinal artery occlusion; BRAO branch retinal artery occlusion; RT retinal tear; SB scleral buckle; PPV pars plana vitrectomy; VH Vitreous hemorrhage; PRP panretinal laser photocoagulation; IVK intravitreal kenalog; VMT vitreomacular traction; MH Macular hole;  NVD neovascularization of the disc; NVE neovascularization elsewhere; AREDS age related eye disease study; ARMD age related macular degeneration; POAG primary open angle glaucoma; EBMD epithelial/anterior basement membrane dystrophy; ACIOL anterior chamber intraocular lens; IOL intraocular lens; PCIOL posterior chamber intraocular lens; Phaco/IOL phacoemulsification with intraocular lens placement; [place] photorefractive keratectomy; LASIK laser assisted in situ keratomileusis; HTN hypertension; DM diabetes mellitus; COPD chronic obstructive pulmonary disease

## 2020-07-09 DIAGNOSIS — E782 Mixed hyperlipidemia: Secondary | ICD-10-CM | POA: Diagnosis not present

## 2020-07-09 DIAGNOSIS — Z139 Encounter for screening, unspecified: Secondary | ICD-10-CM | POA: Diagnosis not present

## 2020-07-09 DIAGNOSIS — K219 Gastro-esophageal reflux disease without esophagitis: Secondary | ICD-10-CM | POA: Diagnosis not present

## 2020-07-09 DIAGNOSIS — Z6828 Body mass index (BMI) 28.0-28.9, adult: Secondary | ICD-10-CM | POA: Diagnosis not present

## 2020-07-09 DIAGNOSIS — I10 Essential (primary) hypertension: Secondary | ICD-10-CM | POA: Diagnosis not present

## 2020-07-09 DIAGNOSIS — E042 Nontoxic multinodular goiter: Secondary | ICD-10-CM | POA: Diagnosis not present

## 2020-07-09 DIAGNOSIS — Z79899 Other long term (current) drug therapy: Secondary | ICD-10-CM | POA: Diagnosis not present

## 2020-07-09 DIAGNOSIS — R7303 Prediabetes: Secondary | ICD-10-CM | POA: Diagnosis not present

## 2020-07-22 ENCOUNTER — Encounter (INDEPENDENT_AMBULATORY_CARE_PROVIDER_SITE_OTHER): Payer: Self-pay | Admitting: Ophthalmology

## 2020-07-22 ENCOUNTER — Other Ambulatory Visit: Payer: Self-pay

## 2020-07-22 ENCOUNTER — Ambulatory Visit (INDEPENDENT_AMBULATORY_CARE_PROVIDER_SITE_OTHER): Payer: Medicare HMO | Admitting: Ophthalmology

## 2020-07-22 DIAGNOSIS — H353132 Nonexudative age-related macular degeneration, bilateral, intermediate dry stage: Secondary | ICD-10-CM

## 2020-07-22 DIAGNOSIS — H35352 Cystoid macular degeneration, left eye: Secondary | ICD-10-CM | POA: Diagnosis not present

## 2020-07-22 DIAGNOSIS — H43821 Vitreomacular adhesion, right eye: Secondary | ICD-10-CM | POA: Diagnosis not present

## 2020-07-22 DIAGNOSIS — H43822 Vitreomacular adhesion, left eye: Secondary | ICD-10-CM | POA: Diagnosis not present

## 2020-07-22 DIAGNOSIS — H353211 Exudative age-related macular degeneration, right eye, with active choroidal neovascularization: Secondary | ICD-10-CM | POA: Diagnosis not present

## 2020-07-22 DIAGNOSIS — H353221 Exudative age-related macular degeneration, left eye, with active choroidal neovascularization: Secondary | ICD-10-CM | POA: Diagnosis not present

## 2020-07-22 MED ORDER — BEVACIZUMAB 2.5 MG/0.1ML IZ SOSY
2.5000 mg | PREFILLED_SYRINGE | INTRAVITREAL | Status: AC | PRN
  Administered 2020-07-22: 2.5 mg via INTRAVITREAL

## 2020-07-22 NOTE — Assessment & Plan Note (Signed)
As component of CNVM improving

## 2020-07-22 NOTE — Addendum Note (Signed)
Addended by: Deloria Lair A on: 07/22/2020 10:08 AM   Modules accepted: Level of Service

## 2020-07-22 NOTE — Assessment & Plan Note (Signed)
New findings today on OCT, will need to commence with therapy OD, intravitreal Avastin and follow-up again in 5 weeks right eye

## 2020-07-22 NOTE — Assessment & Plan Note (Signed)
Less active overall with successful stabilization and prevention of scotoma  growth over the last 3 months.

## 2020-07-22 NOTE — Progress Notes (Signed)
07/22/2020     CHIEF COMPLAINT Patient presents for Retina Follow Up (3 month fu OU/Avastin OS/Pt states, "In my OS instead of seeing a large brown spot it has gotten much lighter and I feel like I can see a little more through it."/Denies any FOL or floaters. /Pt reports still taking eye vitamins)   HISTORY OF PRESENT ILLNESS: Deborah Solomon is a 85 y.o. female who presents to the clinic today for:   HPI    Retina Follow Up    Diagnosis: Wet AMD   Laterality: left eye   Onset: 3 months ago   Severity: mild   Duration: 3 months   Comments: 3 month fu OU/Avastin OS Pt states, "In my OS instead of seeing a large brown spot it has gotten much lighter and I feel like I can see a little more through it." Denies any FOL or floaters.  Pt reports still taking eye vitamins       Last edited by Kendra Opitz, COA on 07/22/2020  9:19 AM. (History)      Referring physician: Practice, [place],  [place] [phone]  HISTORICAL INFORMATION:   Selected notes from the [facility]: No current outpatient medications on file. (Ophthalmic Drugs)   No current facility-administered medications for this visit. (Ophthalmic Drugs)   Current Outpatient Medications (Other)  Medication Sig  . amLODipine (NORVASC) 5 MG tablet Take 5 mg by mouth daily.  . carvedilol (COREG) 25 MG tablet Take 25 mg by mouth 2 (two) times daily.  Marland Kitchen doxazosin (CARDURA) 8 MG tablet Take 12 mg by mouth at bedtime.  Marland Kitchen estradiol (ESTRACE) 0.1 MG/GM vaginal cream Place 1 g vaginally 2 (two) times a week.  . rosuvastatin (CRESTOR) 10 MG tablet Take 10 mg by mouth at bedtime.   No current facility-administered medications for this visit. (Other)      REVIEW OF SYSTEMS:    ALLERGIES No Known Allergies  PAST MEDICAL HISTORY Past Medical History:  Diagnosis Date  . Hypertension    Past Surgical History:  Procedure Laterality Date  .  CATARACT EXTRACTION Bilateral 08/31/1998    FAMILY HISTORY History reviewed. No pertinent family history.  SOCIAL HISTORY Social History   Tobacco Use  . Smoking status: Never Smoker  . Smokeless tobacco: Never Used         OPHTHALMIC EXAM: Base Eye Exam    Visual Acuity (ETDRS)      Right Left   Dist cc 20/40 -1 CF at 3'   Dist ph cc 20/30 -2    Correction: Glasses       Tonometry (Tonopen, 9:22 AM)      Right Left   Pressure 09 10       Pupils      Pupils Dark Light Shape React APD   Right PERRL 3 2 Round Brisk None   Left PERRL 3 2 Round Brisk None       Visual Fields (Counting fingers)      Left Right    Full Full       Neuro/Psych    Oriented x3: Yes   Mood/Affect: Normal       Dilation    Both eyes: 1.0% Mydriacyl, 2.5% Phenylephrine @ 9:22 AM        Slit Lamp and Fundus Exam    External Exam      Right Left   External  Normal Normal       Slit Lamp Exam      Right Left   Lids/Lashes Normal Normal   Conjunctiva/Sclera White and quiet White and quiet   Cornea Clear Clear   Anterior Chamber Deep and quiet Deep and quiet   Iris Round and reactive Round and reactive   Lens Posterior chamber intraocular lens Posterior chamber intraocular lens   Anterior Vitreous Normal Normal       Fundus Exam      Right Left   Posterior Vitreous Normal Normal   Disc Normal Normal   C/D Ratio 0.4 0.3   Macula Geographic atrophy, Retinal pigment epithelial mottling Retinal pigment epithelial atrophy, Disciform scar, no hemorrhage, Geographic atrophy, Cystoid macular edema   Vessels Normal Normal   Periphery Normal Normal          IMAGING AND PROCEDURES  Imaging and Procedures for 07/22/20  OCT, Retina - OU - Both Eyes       Right Eye Quality was good. Scan locations included subfoveal. Central Foveal Thickness: 227. Progression has been stable. Findings include abnormal foveal contour, retinal drusen , no SRF, central retinal atrophy, outer  retinal atrophy, subretinal scarring.   Left Eye Quality was good. Central Foveal Thickness: 486. Progression has been stable. Findings include abnormal foveal contour, subretinal hyper-reflective material, subretinal fluid, intraretinal fluid, disciform scar, subretinal scarring, vitreous traction, vitreomacular adhesion .   Notes  OD at 41-month follow-up, with new finding superior to the fovea of intraretinal fluid and macular thickening.  We will need to commence with therapy in the right eye in the near future, or today  OS with persistent chronic intraretinal fluid and subretinal fluid overlying disciform macular scar, no interval change, since last injection 3 months previous, vastly improved overall       Intravitreal Injection, Pharmacologic Agent - OD - Right Eye       Time Out 07/22/2020. 10:05 AM. Confirmed correct patient, procedure, site, and patient consented.   Anesthesia Topical anesthesia was used. Anesthetic medications included Akten 3.5%.   Procedure Preparation included Tobramycin 0.3%, 10% betadine to eyelids, 5% betadine to ocular surface. A 30 gauge needle was used.   Injection:  2.5 mg Bevacizumab (AVASTIN) 2.5mg /0.64mL SOSY   NDC: 09326-712-45, Lot: [id number]   Route: Intravitreal, Site: Right Eye  Post-op Post injection exam found visual acuity of at least counting fingers. The patient tolerated the procedure well. There were no complications. The patient received written and verbal post procedure care education.        Intravitreal Injection, Pharmacologic Agent - OS - Left Eye       Time Out 07/22/2020. 10:05 AM. Confirmed correct patient, procedure, site, and patient consented.   Anesthesia Topical anesthesia was used. Anesthetic medications included Akten 3.5%.   Procedure Preparation included Tobramycin 0.3%, Ofloxacin , 10% betadine to eyelids, 5% betadine to ocular surface. A supplied needle was used.   Injection:  2.5 mg Bevacizumab  (AVASTIN) 2.5mg /0.46mL SOSY   NDC: 82505-397-67, Lot: [id number]   Route: Intravitreal, Site: Left Eye  Post-op Post injection exam found visual acuity of at least counting fingers. The patient tolerated the procedure well. There were no complications. The patient received written and verbal post procedure care education. Post injection medications were not given.                 ASSESSMENT/PLAN:  Exudative age-related macular degeneration of right eye with active choroidal neovascularization ([place]) New findings today on OCT, will need  to commence with therapy OD, intravitreal Avastin and follow-up again in 5 weeks right eye  Intermediate stage nonexudative age-related macular degeneration of both eyes Atrophy macular region right eye beginning to account for some acuity change  Cystoid macular edema of left eye As component of CNVM improving  Exudative age-related macular degeneration of left eye with active choroidal neovascularization (HCC) Less active overall with successful stabilization and prevention of scotoma  growth over the last 3 months.  Vitreomacular adhesion of right eye No impact on acuity  OD  Vitreomacular adhesion of left eye No impact on acuity OS      ICD-10-CM   1. Exudative age-related macular degeneration of left eye with active choroidal neovascularization (HCC)  H35.3221 OCT, Retina - OU - Both Eyes    Intravitreal Injection, Pharmacologic Agent - OS - Left Eye    bevacizumab (AVASTIN) SOSY 2.5 mg  2. Exudative age-related macular degeneration of right eye with active choroidal neovascularization (HCC)  H35.3211 Intravitreal Injection, Pharmacologic Agent - OD - Right Eye    bevacizumab (AVASTIN) SOSY 2.5 mg  3. Intermediate stage nonexudative age-related macular degeneration of both eyes  H35.3132   4. Cystoid macular edema of left eye  H35.352   5. Vitreomacular adhesion of right eye  H43.821   6. Vitreomacular adhesion of left eye  H43.822      1.  OS, stabilized scotoma on intravitreal Avastin out 26-month follow-up.  Repeat injection today to stabilize  2.  Retinal examination and OCT today confirmed the presence of new extra foveal CNVM superior the fovea OD threatening visual acuity preservation.  After appropriate sign consent was obtained, will commence therapy with intravitreal Avastin OD today as well  3.  Ophthalmic Meds Ordered this visit:  Meds ordered this encounter  Medications  . bevacizumab (AVASTIN) SOSY 2.5 mg  . bevacizumab (AVASTIN) SOSY 2.5 mg       Return in about 5 weeks (around 08/26/2020) for dilate, OD, AVASTIN OCT,, and follow-up OS dilate OS Avastin OCT 12 weeks.  There are no Patient Instructions on file for this visit.   Explained the diagnoses, plan, and follow up with the patient and they expressed understanding.  Patient expressed understanding of the importance of proper follow up care.   Clent Demark Patrina Andreas M.D. Diseases & Surgery of the Retina and Vitreous Retina & Diabetic [place] 07/22/20     Abbreviations: M myopia (nearsighted); A astigmatism; H hyperopia (farsighted); P presbyopia; Mrx spectacle prescription;  CTL contact lenses; OD right eye; OS left eye; OU both eyes  XT exotropia; ET esotropia; PEK punctate epithelial keratitis; PEE punctate epithelial erosions; DES dry eye syndrome; MGD meibomian gland dysfunction; ATs artificial tears; PFAT's preservative free artificial tears; [place] nuclear sclerotic cataract; PSC posterior subcapsular cataract; ERM epi-retinal membrane; PVD posterior vitreous detachment; RD retinal detachment; DM diabetes mellitus; DR diabetic retinopathy; NPDR non-proliferative diabetic retinopathy; PDR proliferative diabetic retinopathy; CSME clinically significant macular edema; DME diabetic macular edema; dbh dot blot hemorrhages; CWS cotton wool spot; POAG primary open angle glaucoma; C/D cup-to-disc ratio; HVF humphrey visual field; GVF goldmann visual  field; OCT optical coherence tomography; IOP intraocular pressure; BRVO Branch retinal vein occlusion; CRVO central retinal vein occlusion; CRAO central retinal artery occlusion; BRAO branch retinal artery occlusion; RT retinal tear; SB scleral buckle; PPV pars plana vitrectomy; VH Vitreous hemorrhage; PRP panretinal laser photocoagulation; IVK intravitreal kenalog; VMT vitreomacular traction; MH Macular hole;  NVD neovascularization of the disc; NVE neovascularization elsewhere; AREDS age related eye disease  study; ARMD age related macular degeneration; POAG primary open angle glaucoma; EBMD epithelial/anterior basement membrane dystrophy; ACIOL anterior chamber intraocular lens; IOL intraocular lens; PCIOL posterior chamber intraocular lens; Phaco/IOL phacoemulsification with intraocular lens placement; [place] photorefractive keratectomy; LASIK laser assisted in situ keratomileusis; HTN hypertension; DM diabetes mellitus; COPD chronic obstructive pulmonary disease

## 2020-07-22 NOTE — Assessment & Plan Note (Signed)
No impact on acuity OS ?

## 2020-07-22 NOTE — Assessment & Plan Note (Signed)
Atrophy macular region right eye beginning to account for some acuity change

## 2020-07-22 NOTE — Assessment & Plan Note (Signed)
No impact on acuity OD ?

## 2020-08-06 DIAGNOSIS — Z7722 Contact with and (suspected) exposure to environmental tobacco smoke (acute) (chronic): Secondary | ICD-10-CM | POA: Diagnosis not present

## 2020-08-06 DIAGNOSIS — K219 Gastro-esophageal reflux disease without esophagitis: Secondary | ICD-10-CM | POA: Diagnosis not present

## 2020-08-06 DIAGNOSIS — Z6827 Body mass index (BMI) 27.0-27.9, adult: Secondary | ICD-10-CM | POA: Diagnosis not present

## 2020-08-06 DIAGNOSIS — E785 Hyperlipidemia, unspecified: Secondary | ICD-10-CM | POA: Diagnosis not present

## 2020-08-06 DIAGNOSIS — R69 Illness, unspecified: Secondary | ICD-10-CM | POA: Diagnosis not present

## 2020-08-06 DIAGNOSIS — Z008 Encounter for other general examination: Secondary | ICD-10-CM | POA: Diagnosis not present

## 2020-08-06 DIAGNOSIS — E663 Overweight: Secondary | ICD-10-CM | POA: Diagnosis not present

## 2020-08-06 DIAGNOSIS — R32 Unspecified urinary incontinence: Secondary | ICD-10-CM | POA: Diagnosis not present

## 2020-08-06 DIAGNOSIS — I10 Essential (primary) hypertension: Secondary | ICD-10-CM | POA: Diagnosis not present

## 2020-08-06 DIAGNOSIS — J309 Allergic rhinitis, unspecified: Secondary | ICD-10-CM | POA: Diagnosis not present

## 2020-08-06 DIAGNOSIS — H547 Unspecified visual loss: Secondary | ICD-10-CM | POA: Diagnosis not present

## 2020-08-26 ENCOUNTER — Ambulatory Visit (INDEPENDENT_AMBULATORY_CARE_PROVIDER_SITE_OTHER): Payer: Medicare HMO | Admitting: Ophthalmology

## 2020-08-26 ENCOUNTER — Other Ambulatory Visit: Payer: Self-pay

## 2020-08-26 ENCOUNTER — Encounter (INDEPENDENT_AMBULATORY_CARE_PROVIDER_SITE_OTHER): Payer: Medicare HMO | Admitting: Ophthalmology

## 2020-08-26 ENCOUNTER — Encounter (INDEPENDENT_AMBULATORY_CARE_PROVIDER_SITE_OTHER): Payer: Self-pay | Admitting: Ophthalmology

## 2020-08-26 DIAGNOSIS — H353211 Exudative age-related macular degeneration, right eye, with active choroidal neovascularization: Secondary | ICD-10-CM | POA: Diagnosis not present

## 2020-08-26 DIAGNOSIS — H353221 Exudative age-related macular degeneration, left eye, with active choroidal neovascularization: Secondary | ICD-10-CM

## 2020-08-26 MED ORDER — BEVACIZUMAB 2.5 MG/0.1ML IZ SOSY
2.5000 mg | PREFILLED_SYRINGE | INTRAVITREAL | Status: AC | PRN
  Administered 2020-08-26: 2.5 mg via INTRAVITREAL

## 2020-08-26 NOTE — Progress Notes (Signed)
08/26/2020     CHIEF COMPLAINT Patient presents for Retina Follow Up (5 Wk F/U OD, poss Avastin OD//Pt denies noticeable changes to [place] OU since last visit. Pt denies ocular pain, flashes of light, or floaters OU. //)   HISTORY OF PRESENT ILLNESS: Deborah Solomon is a 85 y.o. female who presents to the clinic today for:   HPI     Retina Follow Up           Diagnosis: Wet AMD   Laterality: right eye   Onset: 5 weeks ago   Severity: mild   Duration: 5 weeks   Course: stable   Comments: 5 Wk F/U OD, poss Avastin OD  Pt denies noticeable changes to [place] OU since last visit. Pt denies ocular pain, flashes of light, or floaters OU.          Last edited by Milly Jakob, [place] on 08/26/2020  8:54 AM.      Referring physician: Practice, [place],  [place] [phone]  HISTORICAL INFORMATION:   Selected notes from the MEDICAL RECORD NUMBER       CURRENT MEDICATIONS: No current outpatient medications on file. (Ophthalmic Drugs)   No current facility-administered medications for this visit. (Ophthalmic Drugs)   Current Outpatient Medications (Other)  Medication Sig   amLODipine (NORVASC) 5 MG tablet Take 5 mg by mouth daily.   carvedilol (COREG) 25 MG tablet Take 25 mg by mouth 2 (two) times daily.   doxazosin (CARDURA) 8 MG tablet Take 12 mg by mouth at bedtime.   estradiol (ESTRACE) 0.1 MG/GM vaginal cream Place 1 g vaginally 2 (two) times a week.   rosuvastatin (CRESTOR) 10 MG tablet Take 10 mg by mouth at bedtime.   No current facility-administered medications for this visit. (Other)      REVIEW OF SYSTEMS:    ALLERGIES No Known Allergies  PAST MEDICAL HISTORY Past Medical History:  Diagnosis Date   Hypertension    Past Surgical History:  Procedure Laterality Date   CATARACT EXTRACTION Bilateral 08/31/1998    FAMILY HISTORY History reviewed. No pertinent family history.  SOCIAL HISTORY Social History   Tobacco  Use   Smoking status: Never   Smokeless tobacco: Never         OPHTHALMIC EXAM:  Base Eye Exam     Visual Acuity (ETDRS)       Right Left   Dist cc 20/25 -2 CF at 3'   Dist ph cc  NI    Correction: Glasses         Tonometry (Tonopen, 8:58 AM)       Right Left   Pressure 10 11         Pupils       Pupils Dark Light Shape React APD   Right PERRL 3 2 Round Brisk None   Left PERRL 3 2 Round Brisk None         Visual Fields (Counting fingers)       Left Right    Full Full         Extraocular Movement       Right Left    Full Full         Neuro/Psych     Oriented x3: Yes   Mood/Affect: Normal         Dilation     Right eye: 1.0% Mydriacyl, 2.5% Phenylephrine @ 8:58 AM  Slit Lamp and Fundus Exam     External Exam       Right Left   External Normal Normal         Slit Lamp Exam       Right Left   Lids/Lashes Normal Normal   Conjunctiva/Sclera White and quiet White and quiet   Cornea Clear Clear   Anterior Chamber Deep and quiet Deep and quiet   Iris Round and reactive Round and reactive   Lens Posterior chamber intraocular lens Posterior chamber intraocular lens   Anterior Vitreous Normal Normal         Fundus Exam       Right Left   Posterior Vitreous Normal    Disc Normal    C/D Ratio 0.4    Macula Geographic atrophy, Retinal pigment epithelial mottling    Vessels Normal    Periphery Normal             IMAGING AND PROCEDURES  Imaging and Procedures for 08/26/20  OCT, Retina - OU - Both Eyes       Right Eye Quality was good. Scan locations included subfoveal. Central Foveal Thickness: 207. Progression has been stable. Findings include abnormal foveal contour, retinal drusen , no SRF, central retinal atrophy, outer retinal atrophy, subretinal scarring, no IRF.   Left Eye Quality was good. Central Foveal Thickness: 491. Progression has been stable. Findings include abnormal foveal contour,  subretinal hyper-reflective material, subretinal fluid, intraretinal fluid, disciform scar, subretinal scarring, vitreous traction, vitreomacular adhesion .   Notes  OD 5-week follow-up OD today, small region of intraretinal fluid superiorly has improved today at 5 weeks post Avastin No. 1 injection, repeat injection OD today OS with persistent chronic intraretinal fluid and subretinal fluid overlying disciform macular scar, no interval change, since last injection 4 months previous, vastly improved overall     Intravitreal Injection, Pharmacologic Agent - OD - Right Eye       Time Out 08/26/2020. 9:55 AM. Confirmed correct patient, procedure, site, and patient consented.   Anesthesia Topical anesthesia was used. Anesthetic medications included Akten 3.5%.   Procedure Preparation included Tobramycin 0.3%, 10% betadine to eyelids, 5% betadine to ocular surface. A 30 gauge needle was used.   Injection: 2.5 mg bevacizumab 2.5 MG/0.1ML   Route: Intravitreal   NDC: [phone], Lot: [id number]   Post-op Post injection exam found visual acuity of at least counting fingers. The patient tolerated the procedure well. There were no complications. The patient received written and verbal post procedure care education.              ASSESSMENT/PLAN:  Exudative age-related macular degeneration of right eye with active choroidal neovascularization (HCC) OD vastly improved 5 weeks post Avastin No. 1 injection.  Much less intraretinal fluid.  Repeat injection OD today Avastin and follow-up again in 5-week     ICD-10-CM   1. Exudative age-related macular degeneration of right eye with active choroidal neovascularization (HCC)  H35.3211 OCT, Retina - OU - Both Eyes    Intravitreal Injection, Pharmacologic Agent - OD - Right Eye    bevacizumab (AVASTIN) SOSY 2.5 mg      1.  Improved post Avastin No. 1 injection OD at 5-week interval.  We will repeat injection today to maintain visual  functioning in past acuity I  2.  Dilate OU next in 5 weeks possible injection OU Avastin  3.  Ophthalmic Meds Ordered this visit:  Meds ordered this encounter  Medications   bevacizumab (AVASTIN)  SOSY 2.5 mg       Return in about 5 weeks (around 09/30/2020) for DILATE OU, AVASTIN OCT, OD, OS.  There are no Patient Instructions on file for this visit.   Explained the diagnoses, plan, and follow up with the patient and they expressed understanding.  Patient expressed understanding of the importance of proper follow up care.   Clent Demark Kostantinos Tallman M.D. Diseases & Surgery of the Retina and Vitreous [facility]ina [facility]abetic [place] 08/26/20     Abbreviations: M myopia (nearsighted); A astigmatism; H hyperopia (farsighted); P presbyopia; Mrx spectacle prescription;  CTL contact lenses; OD right eye; OS left eye; OU both eyes  XT exotropia; ET esotropia; PEK punctate epithelial keratitis; PEE punctate epithelial erosions; DES dry eye syndrome; MGD meibomian gland dysfunction; ATs artificial tears; PFAT's preservative free artificial tears; [place] nuclear sclerotic cataract; PSC posterior subcapsular cataract; ERM epi-retinal membrane; PVD posterior vitreous detachment; RD retinal detachment; DM diabetes mellitus; DR diabetic retinopathy; NPDR non-proliferative diabetic retinopathy; PDR proliferative diabetic retinopathy; CSME clinically significant macular edema; DME diabetic macular edema; dbh dot blot hemorrhages; CWS cotton wool spot; POAG primary open angle glaucoma; C/D cup-to-disc ratio; HVF humphrey visual field; GVF goldmann visual field; OCT optical coherence tomography; IOP intraocular pressure; BRVO Branch retinal vein occlusion; CRVO central retinal vein occlusion; CRAO central retinal artery occlusion; BRAO branch retinal artery occlusion; RT retinal tear; SB scleral buckle; PPV pars plana vitrectomy; VH Vitreous hemorrhage; PRP panretinal laser photocoagulation; IVK intravitreal kenalog; VMT  vitreomacular traction; MH Macular hole;  NVD neovascularization of the disc; NVE neovascularization elsewhere; AREDS age related eye disease study; ARMD age related macular degeneration; POAG primary open angle glaucoma; EBMD epithelial/anterior basement membrane dystrophy; ACIOL anterior chamber intraocular lens; IOL intraocular lens; PCIOL posterior chamber intraocular lens; Phaco/IOL phacoemulsification with intraocular lens placement; [place] photorefractive keratectomy; LASIK laser assisted in situ keratomileusis; HTN hypertension; DM diabetes mellitus; COPD chronic obstructive pulmonary disease

## 2020-08-26 NOTE — Assessment & Plan Note (Signed)
Still active at 4 months subfoveal disease.  Schedule follow-up in 5 weeks coincident with the right eye per patient request.  Likely injection left eye to maintain and stabilize

## 2020-08-26 NOTE — Assessment & Plan Note (Signed)
OD vastly improved 5 weeks post Avastin No. 1 injection.  Much less intraretinal fluid.  Repeat injection OD today Avastin and follow-up again in 5-week

## 2020-09-02 DIAGNOSIS — Z4689 Encounter for fitting and adjustment of other specified devices: Secondary | ICD-10-CM | POA: Diagnosis not present

## 2020-09-02 DIAGNOSIS — N8111 Cystocele, midline: Secondary | ICD-10-CM | POA: Diagnosis not present

## 2020-09-02 DIAGNOSIS — N952 Postmenopausal atrophic vaginitis: Secondary | ICD-10-CM | POA: Diagnosis not present

## 2020-09-30 ENCOUNTER — Encounter (INDEPENDENT_AMBULATORY_CARE_PROVIDER_SITE_OTHER): Payer: Medicare HMO | Admitting: Ophthalmology

## 2020-09-30 ENCOUNTER — Encounter (INDEPENDENT_AMBULATORY_CARE_PROVIDER_SITE_OTHER): Payer: Self-pay | Admitting: Ophthalmology

## 2020-09-30 ENCOUNTER — Ambulatory Visit (INDEPENDENT_AMBULATORY_CARE_PROVIDER_SITE_OTHER): Payer: Medicare HMO | Admitting: Ophthalmology

## 2020-09-30 ENCOUNTER — Other Ambulatory Visit: Payer: Self-pay

## 2020-09-30 DIAGNOSIS — H353211 Exudative age-related macular degeneration, right eye, with active choroidal neovascularization: Secondary | ICD-10-CM

## 2020-09-30 DIAGNOSIS — H353221 Exudative age-related macular degeneration, left eye, with active choroidal neovascularization: Secondary | ICD-10-CM

## 2020-09-30 DIAGNOSIS — H353124 Nonexudative age-related macular degeneration, left eye, advanced atrophic with subfoveal involvement: Secondary | ICD-10-CM | POA: Diagnosis not present

## 2020-09-30 MED ORDER — BEVACIZUMAB 2.5 MG/0.1ML IZ SOSY
2.5000 mg | PREFILLED_SYRINGE | INTRAVITREAL | Status: AC | PRN
  Administered 2020-09-30: 2.5 mg via INTRAVITREAL

## 2020-09-30 NOTE — Assessment & Plan Note (Signed)
We will repeat injection today at 5-week interval and extend interval examination next to 6 weeks OD

## 2020-09-30 NOTE — Progress Notes (Signed)
09/30/2020     CHIEF COMPLAINT Patient presents for Macular Degeneration and Retina Follow Up (5 Wk F/U OD, poss Avastin OD,,  and 11 week interval exam OS//Pt denies noticeable changes to [place] OU since last visit. Pt denies ocular pain, flashes of light, or floaters OU. //)   HISTORY OF PRESENT ILLNESS: Deborah Solomon is a 85 y.o. female who presents to the clinic today for:   HPI     Retina Follow Up           Diagnosis: Wet AMD   Laterality: both eyes   Onset: 5 weeks ago   Severity: mild   Duration: 5 weeks   Course: stable   Comments: 5 Wk F/U OD, poss Avastin OD,,  and 11 week interval exam OS  Pt denies noticeable changes to [place] OU since last visit. Pt denies ocular pain, flashes of light, or floaters OU.          Last edited by Hurman Horn, MD on 09/30/2020  9:24 AM.      Referring physician: Practice, [place],  [place] [phone]  HISTORICAL INFORMATION:   Selected notes from the MEDICAL RECORD NUMBER       CURRENT MEDICATIONS: No current outpatient medications on file. (Ophthalmic Drugs)   No current facility-administered medications for this visit. (Ophthalmic Drugs)   Current Outpatient Medications (Other)  Medication Sig   amLODipine (NORVASC) 5 MG tablet Take 5 mg by mouth daily.   carvedilol (COREG) 25 MG tablet Take 25 mg by mouth 2 (two) times daily.   doxazosin (CARDURA) 8 MG tablet Take 12 mg by mouth at bedtime.   estradiol (ESTRACE) 0.1 MG/GM vaginal cream Place 1 g vaginally 2 (two) times a week.   rosuvastatin (CRESTOR) 10 MG tablet Take 10 mg by mouth at bedtime.   No current facility-administered medications for this visit. (Other)      REVIEW OF SYSTEMS:    ALLERGIES No Known Allergies  PAST MEDICAL HISTORY Past Medical History:  Diagnosis Date   Hypertension    Past Surgical History:  Procedure Laterality Date   CATARACT EXTRACTION Bilateral 08/31/1998    FAMILY  HISTORY History reviewed. No pertinent family history.  SOCIAL HISTORY Social History   Tobacco Use   Smoking status: Never   Smokeless tobacco: Never         OPHTHALMIC EXAM:  Base Eye Exam     Visual Acuity (ETDRS)       Right Left   Dist cc 20/25 -1 CF at 5'   Dist ph cc NI NI         Tonometry (Tonopen, 9:25 AM)       Right Left   Pressure 6 7         Pupils       Pupils APD   Right PERRL None   Left PERRL None         Visual Fields       Left Right    Full    Restrictions  Central [place]         Extraocular Movement       Right Left    Full, Ortho Full, Ortho         Neuro/Psych     Oriented x3: Yes   Mood/Affect: Normal         Dilation     Both eyes: 1.0% Mydriacyl, 2.5% Phenylephrine @ 9:26 AM  Slit Lamp and Fundus Exam     External Exam       Right Left   External Normal Normal         Slit Lamp Exam       Right Left   Lids/Lashes Normal Normal   Conjunctiva/Sclera White and quiet White and quiet   Cornea Clear Clear   Anterior Chamber Deep and quiet Deep and quiet   Iris Round and reactive Round and reactive   Lens Posterior chamber intraocular lens Posterior chamber intraocular lens   Anterior Vitreous Normal Normal         Fundus Exam       Right Left   Posterior Vitreous Normal Normal   Disc Normal Normal   C/D Ratio 0.4 0.3   Macula Geographic atrophy, Retinal pigment epithelial mottling Retinal pigment epithelial atrophy, Disciform scar, no hemorrhage, Geographic atrophy, Cystoid macular edema   Vessels Normal Normal   Periphery Normal Normal            IMAGING AND PROCEDURES  Imaging and Procedures for 09/30/20  OCT, Retina - OU - Both Eyes       Right Eye Quality was good. Scan locations included subfoveal. Central Foveal Thickness: 207. Progression has been stable. Findings include abnormal foveal contour, retinal drusen , no SRF, central retinal atrophy, outer  retinal atrophy, subretinal scarring, no IRF, vitreomacular adhesion .   Left Eye Quality was good. Central Foveal Thickness: 491. Progression has been stable. Findings include abnormal foveal contour, subretinal hyper-reflective material, subretinal fluid, intraretinal fluid, disciform scar, subretinal scarring, vitreous traction, vitreomacular adhesion .   Notes  OD 5-week follow-up OD today, small region of intraretinal fluid superiorly has improved today at 5 weeks post Avastin injection, repeat injection OD today OS with persistent chronic intraretinal fluid and subretinal fluid overlying disciform macular scar, no interval change, since last injection 3 months previous, vastly improved overall     Intravitreal Injection, Pharmacologic Agent - OD - Right Eye       Time Out 09/30/2020. 9:58 AM. Confirmed correct patient, procedure, site, and patient consented.   Anesthesia Topical anesthesia was used. Anesthetic medications included Akten 3.5%.   Procedure Preparation included Tobramycin 0.3%, 10% betadine to eyelids, 5% betadine to ocular surface. A 30 gauge needle was used.   Injection: 2.5 mg bevacizumab 2.5 MG/0.1ML   Route: Intravitreal, Site: Right Eye   NDC: [phone]   Post-op Post injection exam found visual acuity of at least counting fingers. The patient tolerated the procedure well. There were no complications. The patient received written and verbal post procedure care education.      Intravitreal Injection, Pharmacologic Agent - OS - Left Eye       Time Out 09/30/2020. 10:00 AM. Confirmed correct patient, procedure, site, and patient consented.   Anesthesia Topical anesthesia was used. Anesthetic medications included Akten 3.5%.   Procedure Preparation included Tobramycin 0.3%, Ofloxacin , 10% betadine to eyelids, 5% betadine to ocular surface. A supplied needle was used.   Injection: 2.5 mg bevacizumab 2.5 MG/0.1ML   Route: Intravitreal, Site: Left  Eye   NDC: [phone], Lot: [id number]   Post-op Post injection exam found visual acuity of at least counting fingers. The patient tolerated the procedure well. There were no complications. The patient received written and verbal post procedure care education. Post injection medications were not given.              ASSESSMENT/PLAN:  Advanced nonexudative age-related macular degeneration of left  eye with subfoveal involvement Accounts for acuity OS  Exudative age-related macular degeneration of left eye with active choroidal neovascularization (HCC) Chronic edge activity in a subfoveal disciform scar, stable 52-month follow-up interval  Exudative age-related macular degeneration of right eye with active choroidal neovascularization (HCC) We will repeat injection today at 5-week interval and extend interval examination next to 6 weeks OD     ICD-10-CM   1. Exudative age-related macular degeneration of right eye with active choroidal neovascularization (HCC)  H35.3211 OCT, Retina - OU - Both Eyes    Intravitreal Injection, Pharmacologic Agent - OD - Right Eye    bevacizumab (AVASTIN) SOSY 2.5 mg    2. Exudative age-related macular degeneration of left eye with active choroidal neovascularization (HCC)  H35.3221 OCT, Retina - OU - Both Eyes    Intravitreal Injection, Pharmacologic Agent - OS - Left Eye    bevacizumab (AVASTIN) SOSY 2.5 mg    3. Advanced nonexudative age-related macular degeneration of left eye with subfoveal involvement  H35.3124       1.  OD stabilized with preservation of good acuity post onset of new onset CNVM OD May 2022.  Repeat injection OD today and examination next OD in 6 weeks  2.  OS stable again at 12-week interval follow-up, 3 months.  We will repeat injection today  3.  Ophthalmic Meds Ordered this visit:  Meds ordered this encounter  Medications   bevacizumab (AVASTIN) SOSY 2.5 mg   bevacizumab (AVASTIN) SOSY 2.5 mg       Return in about  6 weeks (around 11/11/2020) for dilate, OD, AVASTIN OCT.  There are no Patient Instructions on file for this visit.   Explained the diagnoses, plan, and follow up with the patient and they expressed understanding.  Patient expressed understanding of the importance of proper follow up care.   Clent Demark Lyndsie Wallman M.D. Diseases & Surgery of the Retina and Vitreous Retina [facility] Diabetic [place] 09/30/20     Abbreviations: M myopia (nearsighted); A astigmatism; H hyperopia (farsighted); P presbyopia; Mrx spectacle prescription;  CTL contact lenses; OD right eye; OS left eye; OU both eyes  XT exotropia; ET esotropia; PEK punctate epithelial keratitis; PEE punctate epithelial erosions; DES dry eye syndrome; MGD meibomian gland dysfunction; ATs artificial tears; PFAT's preservative free artificial tears; [place] nuclear sclerotic cataract; PSC posterior subcapsular cataract; ERM epi-retinal membrane; PVD posterior vitreous detachment; RD retinal detachment; DM diabetes mellitus; DR diabetic retinopathy; NPDR non-proliferative diabetic retinopathy; PDR proliferative diabetic retinopathy; CSME clinically significant macular edema; DME diabetic macular edema; dbh dot blot hemorrhages; CWS cotton wool spot; POAG primary open angle glaucoma; C/D cup-to-disc ratio; HVF humphrey visual field; GVF goldmann visual field; OCT optical coherence tomography; IOP intraocular pressure; BRVO Branch retinal vein occlusion; CRVO central retinal vein occlusion; CRAO central retinal artery occlusion; BRAO branch retinal artery occlusion; RT retinal tear; SB scleral buckle; PPV pars plana vitrectomy; VH Vitreous hemorrhage; PRP panretinal laser photocoagulation; IVK intravitreal kenalog; VMT vitreomacular traction; MH Macular hole;  NVD neovascularization of the disc; NVE neovascularization elsewhere; AREDS age related eye disease study; ARMD age related macular degeneration; POAG primary open angle glaucoma; EBMD epithelial/anterior  basement membrane dystrophy; ACIOL anterior chamber intraocular lens; IOL intraocular lens; PCIOL posterior chamber intraocular lens; Phaco/IOL phacoemulsification with intraocular lens placement; [place] photorefractive keratectomy; LASIK laser assisted in situ keratomileusis; HTN hypertension; DM diabetes mellitus; COPD chronic obstructive pulmonary disease

## 2020-09-30 NOTE — Assessment & Plan Note (Signed)
Accounts for acuity OS 

## 2020-09-30 NOTE — Assessment & Plan Note (Signed)
Chronic edge activity in a subfoveal disciform scar, stable 67-month follow-up interval

## 2020-10-08 DIAGNOSIS — Z0100 Encounter for examination of eyes and vision without abnormal findings: Secondary | ICD-10-CM | POA: Diagnosis not present

## 2020-10-08 DIAGNOSIS — H524 Presbyopia: Secondary | ICD-10-CM | POA: Diagnosis not present

## 2020-10-14 ENCOUNTER — Encounter (INDEPENDENT_AMBULATORY_CARE_PROVIDER_SITE_OTHER): Payer: Medicare HMO | Admitting: Ophthalmology

## 2020-11-11 ENCOUNTER — Encounter (INDEPENDENT_AMBULATORY_CARE_PROVIDER_SITE_OTHER): Payer: Self-pay | Admitting: Ophthalmology

## 2020-11-11 ENCOUNTER — Ambulatory Visit (INDEPENDENT_AMBULATORY_CARE_PROVIDER_SITE_OTHER): Payer: Medicare HMO | Admitting: Ophthalmology

## 2020-11-11 ENCOUNTER — Other Ambulatory Visit: Payer: Self-pay

## 2020-11-11 DIAGNOSIS — H43821 Vitreomacular adhesion, right eye: Secondary | ICD-10-CM | POA: Diagnosis not present

## 2020-11-11 DIAGNOSIS — H353221 Exudative age-related macular degeneration, left eye, with active choroidal neovascularization: Secondary | ICD-10-CM | POA: Diagnosis not present

## 2020-11-11 DIAGNOSIS — H353211 Exudative age-related macular degeneration, right eye, with active choroidal neovascularization: Secondary | ICD-10-CM | POA: Diagnosis not present

## 2020-11-11 MED ORDER — BEVACIZUMAB 2.5 MG/0.1ML IZ SOSY
2.5000 mg | PREFILLED_SYRINGE | INTRAVITREAL | Status: AC | PRN
  Administered 2020-11-11: 2.5 mg via INTRAVITREAL

## 2020-11-11 NOTE — Assessment & Plan Note (Signed)
Minor at this time 

## 2020-11-11 NOTE — Assessment & Plan Note (Signed)
Still chronically active disciform scars subfoveal.  Follow-up in 6 weeks as scheduled

## 2020-11-11 NOTE — Progress Notes (Signed)
11/11/2020     CHIEF COMPLAINT Patient presents for  Chief Complaint  Patient presents with   Retina Follow Up    5 Wk F/U OD, poss Avastin OD,,  and 11 week interval exam OS  Pt denies noticeable changes to [place] OU since last visit. Pt denies ocular pain, flashes of light, or floaters OU.         HISTORY OF PRESENT ILLNESS: Deborah Solomon is a 85 y.o. female who presents to the clinic today for:   HPI     Retina Follow Up   Patient presents with  Wet AMD.  In both eyes.  This started 6 weeks ago.  Severity is mild.  Duration of 6 weeks.  Since onset it is stable. Additional comments: 5 Wk F/U OD, poss Avastin OD,,  and 11 week interval exam OS  Pt denies noticeable changes to [place] OU since last visit. Pt denies ocular pain, flashes of light, or floaters OU.           Comments   6 week fu od oct avastin od Patient states vision is stable and unchanged since last visit. Denies any new floaters or FOL. Pt states she takes Preservision AREDS2 supplements, twice a day.        Last edited by Laurin Coder, COA on 11/11/2020  9:06 AM.      Referring physician: Practice, [place],  [place] [phone]  HISTORICAL INFORMATION:   Selected notes from the MEDICAL RECORD NUMBER       CURRENT MEDICATIONS: No current outpatient medications on file. (Ophthalmic Drugs)   No current facility-administered medications for this visit. (Ophthalmic Drugs)   Current Outpatient Medications (Other)  Medication Sig   amLODipine (NORVASC) 5 MG tablet Take 5 mg by mouth daily.   carvedilol (COREG) 25 MG tablet Take 25 mg by mouth 2 (two) times daily.   doxazosin (CARDURA) 8 MG tablet Take 12 mg by mouth at bedtime.   estradiol (ESTRACE) 0.1 MG/GM vaginal cream Place 1 g vaginally 2 (two) times a week.   rosuvastatin (CRESTOR) 10 MG tablet Take 10 mg by mouth at bedtime.   No current facility-administered medications for this visit. (Other)       REVIEW OF SYSTEMS:    ALLERGIES No Known Allergies  PAST MEDICAL HISTORY Past Medical History:  Diagnosis Date   Hypertension    Past Surgical History:  Procedure Laterality Date   CATARACT EXTRACTION Bilateral 08/31/1998    FAMILY HISTORY History reviewed. No pertinent family history.  SOCIAL HISTORY Social History   Tobacco Use   Smoking status: Never   Smokeless tobacco: Never         OPHTHALMIC EXAM:  Base Eye Exam     Visual Acuity (Snellen - Linear)       Right Left   Dist cc 20/30 -2 CF at 3'   Dist ph cc  NI         Tonometry (Tonopen, 9:11 AM)       Right Left   Pressure 10 10         Pupils       Pupils Dark Light APD   Right PERRL 3 2 None   Left PERRL 3 2 None         Neuro/Psych     Oriented x3: Yes   Mood/Affect: Normal         Dilation     Right eye:  1.0% Mydriacyl, 2.5% Phenylephrine @ 9:11 AM           Slit Lamp and Fundus Exam     External Exam       Right Left   External Normal Normal         Slit Lamp Exam       Right Left   Lids/Lashes Normal Normal   Conjunctiva/Sclera White and quiet White and quiet   Cornea Clear Clear   Anterior Chamber Deep and quiet Deep and quiet   Iris Round and reactive Round and reactive   Lens Posterior chamber intraocular lens Posterior chamber intraocular lens   Anterior Vitreous Normal Normal         Fundus Exam       Right Left   Posterior Vitreous Normal    Disc Normal    C/D Ratio 0.4    Macula Geographic atrophy, Retinal pigment epithelial mottling    Vessels Normal    Periphery Normal             IMAGING AND PROCEDURES  Imaging and Procedures for 11/11/20  OCT, Retina - OU - Both Eyes       Right Eye Quality was good. Scan locations included subfoveal. Central Foveal Thickness: 208. Progression has been stable. Findings include abnormal foveal contour, retinal drusen , no SRF, central retinal atrophy, outer retinal atrophy,  subretinal scarring, no IRF, vitreomacular adhesion .   Left Eye Quality was good. Central Foveal Thickness: 452. Progression has been stable. Findings include abnormal foveal contour, subretinal hyper-reflective material, subretinal fluid, intraretinal fluid, disciform scar, subretinal scarring, vitreous traction, vitreomacular adhesion .   Notes  OD 5-week follow-up OD today, small region of intraretinal fluid superiorly has improved today at 5 weeks post Avastin injection, and compared to onset of lesion May 2022, repeat injection OD today OS with persistent chronic intraretinal fluid and subretinal fluid overlying disciform macular scar, no interval change, since last injection 3 months previous, vastly improved overall     Intravitreal Injection, Pharmacologic Agent - OD - Right Eye       Time Out 11/11/2020. 9:42 AM. Confirmed correct patient, procedure, site, and patient consented.   Anesthesia Topical anesthesia was used. Anesthetic medications included Akten 3.5%.   Procedure Preparation included Tobramycin 0.3%, 10% betadine to eyelids, 5% betadine to ocular surface. A 30 gauge needle was used.   Injection: 2.5 mg bevacizumab 2.5 MG/0.1ML   Route: Intravitreal, Site: Right Eye   NDC: [phone], Lot: [id number]   Post-op Post injection exam found visual acuity of at least counting fingers. The patient tolerated the procedure well. There were no complications. The patient received written and verbal post procedure care education.              ASSESSMENT/PLAN:  Exudative age-related macular degeneration of right eye with active choroidal neovascularization [facility]) Onset May 2022, vastly improved lesion superior to FAZ on Avastin currently at 5-week interval.  Repeat injection today and next visit in 6 weeks right eye  Patient wants coincident with left eye evaluation  Exudative age-related macular degeneration of left eye with active choroidal neovascularization  ([place]) Still chronically active disciform scars subfoveal.  Follow-up in 6 weeks as scheduled  Vitreomacular adhesion of right eye Minor at this time     ICD-10-CM   1. Exudative age-related macular degeneration of right eye with active choroidal neovascularization (HCC)  H35.3211 OCT, Retina - OU - Both Eyes    Intravitreal Injection, Pharmacologic Agent - OD -  Right Eye    bevacizumab (AVASTIN) SOSY 2.5 mg    2. Exudative age-related macular degeneration of left eye with active choroidal neovascularization ([place])  H35.3221     3. Vitreomacular adhesion of right eye  H43.821       1.  OD, with good acuity preserved, on therapy for wet AMD onset May 2022.  Repeat injection today at 5 weeks and follow-up next in 6 weeks right eye  2.  Dilate OU next likely injection OU next  3.  Ophthalmic Meds Ordered this visit:  Meds ordered this encounter  Medications   bevacizumab (AVASTIN) SOSY 2.5 mg       Return in about 6 weeks (around 12/23/2020) for DILATE OU, AVASTIN OCT, OD, OS.  There are no Patient Instructions on file for this visit.   Explained the diagnoses, plan, and follow up with the patient and they expressed understanding.  Patient expressed understanding of the importance of proper follow up care.   Clent Demark Griffen Frayne M.D. Diseases & Surgery of the Retina and Vitreous Retina & Diabetic [place] 11/11/20     Abbreviations: M myopia (nearsighted); A astigmatism; H hyperopia (farsighted); P presbyopia; Mrx spectacle prescription;  CTL contact lenses; OD right eye; OS left eye; OU both eyes  XT exotropia; ET esotropia; PEK punctate epithelial keratitis; PEE punctate epithelial erosions; DES dry eye syndrome; MGD meibomian gland dysfunction; ATs artificial tears; PFAT's preservative free artificial tears; [place] nuclear sclerotic cataract; PSC posterior subcapsular cataract; ERM epi-retinal membrane; PVD posterior vitreous detachment; RD retinal detachment; DM diabetes  mellitus; DR diabetic retinopathy; NPDR non-proliferative diabetic retinopathy; PDR proliferative diabetic retinopathy; CSME clinically significant macular edema; DME diabetic macular edema; dbh dot blot hemorrhages; CWS cotton wool spot; POAG primary open angle glaucoma; C/D cup-to-disc ratio; HVF humphrey visual field; GVF goldmann visual field; OCT optical coherence tomography; IOP intraocular pressure; BRVO Branch retinal vein occlusion; CRVO central retinal vein occlusion; CRAO central retinal artery occlusion; BRAO branch retinal artery occlusion; RT retinal tear; SB scleral buckle; PPV pars plana vitrectomy; VH Vitreous hemorrhage; PRP panretinal laser photocoagulation; IVK intravitreal kenalog; VMT vitreomacular traction; MH Macular hole;  NVD neovascularization of the disc; NVE neovascularization elsewhere; AREDS age related eye disease study; ARMD age related macular degeneration; POAG primary open angle glaucoma; EBMD epithelial/anterior basement membrane dystrophy; ACIOL anterior chamber intraocular lens; IOL intraocular lens; PCIOL posterior chamber intraocular lens; Phaco/IOL phacoemulsification with intraocular lens placement; [place] photorefractive keratectomy; LASIK laser assisted in situ keratomileusis; HTN hypertension; DM diabetes mellitus; COPD chronic obstructive pulmonary disease

## 2020-11-11 NOTE — Assessment & Plan Note (Signed)
Onset May 2022, vastly improved lesion superior to FAZ on Avastin currently at 5-week interval.  Repeat injection today and next visit in 6 weeks right eye  Patient wants coincident with left eye evaluation

## 2020-12-23 ENCOUNTER — Other Ambulatory Visit: Payer: Self-pay

## 2020-12-23 ENCOUNTER — Ambulatory Visit (INDEPENDENT_AMBULATORY_CARE_PROVIDER_SITE_OTHER): Payer: Medicare HMO | Admitting: Ophthalmology

## 2020-12-23 ENCOUNTER — Encounter (INDEPENDENT_AMBULATORY_CARE_PROVIDER_SITE_OTHER): Payer: Self-pay | Admitting: Ophthalmology

## 2020-12-23 DIAGNOSIS — H353211 Exudative age-related macular degeneration, right eye, with active choroidal neovascularization: Secondary | ICD-10-CM | POA: Diagnosis not present

## 2020-12-23 DIAGNOSIS — H43822 Vitreomacular adhesion, left eye: Secondary | ICD-10-CM | POA: Diagnosis not present

## 2020-12-23 DIAGNOSIS — H35372 Puckering of macula, left eye: Secondary | ICD-10-CM | POA: Diagnosis not present

## 2020-12-23 DIAGNOSIS — H353221 Exudative age-related macular degeneration, left eye, with active choroidal neovascularization: Secondary | ICD-10-CM | POA: Diagnosis not present

## 2020-12-23 MED ORDER — BEVACIZUMAB 2.5 MG/0.1ML IZ SOSY
2.5000 mg | PREFILLED_SYRINGE | INTRAVITREAL | Status: AC | PRN
  Administered 2020-12-23: 2.5 mg via INTRAVITREAL

## 2020-12-23 NOTE — Assessment & Plan Note (Signed)
Region of involvement superior to the [place] onset May 2022, vastly improved on current therapy.  Repeat today injection Avastin and this monocular patient extend interval examination to 7 weeks

## 2020-12-23 NOTE — Assessment & Plan Note (Signed)
Chronic active subfoveal scarring, counts are acuity.  Today at 6-week follow-up we will repeat injection simply to maintain

## 2020-12-23 NOTE — Assessment & Plan Note (Signed)
OS appears minor

## 2020-12-23 NOTE — Assessment & Plan Note (Signed)
Some component of elevation of the macula may be ongoing from this

## 2020-12-23 NOTE — Progress Notes (Signed)
12/23/2020     CHIEF COMPLAINT Patient presents for  Chief Complaint  Patient presents with   Retina Follow Up      HISTORY OF PRESENT ILLNESS: Deborah Solomon is a 85 y.o. female who presents to the clinic today for:   HPI     Retina Follow Up   Patient presents with  Wet AMD.  In both eyes.  This started 6 weeks ago.  Severity is mild.  Duration of 6 weeks.  Since onset it is stable.        Comments   6 week fu ou and oct and Avastin OU 6 week since injection OD and 12 weeks since injection OS Pt states [facility] OU stable since last visit. Pt denies FOL, floaters, or ocular pain OU.        Last edited by Kendra Opitz, COA on 12/23/2020  9:24 AM.      Referring physician: Practice, [place],  [place] [phone]  HISTORICAL INFORMATION:   Selected notes from the MEDICAL RECORD NUMBER       CURRENT MEDICATIONS: No current outpatient medications on file. (Ophthalmic Drugs)   No current facility-administered medications for this visit. (Ophthalmic Drugs)   Current Outpatient Medications (Other)  Medication Sig   amLODipine (NORVASC) 5 MG tablet Take 5 mg by mouth daily.   carvedilol (COREG) 25 MG tablet Take 25 mg by mouth 2 (two) times daily.   doxazosin (CARDURA) 8 MG tablet Take 12 mg by mouth at bedtime.   estradiol (ESTRACE) 0.1 MG/GM vaginal cream Place 1 g vaginally 2 (two) times a week.   rosuvastatin (CRESTOR) 10 MG tablet Take 10 mg by mouth at bedtime.   No current facility-administered medications for this visit. (Other)      REVIEW OF SYSTEMS:    ALLERGIES No Known Allergies  PAST MEDICAL HISTORY Past Medical History:  Diagnosis Date   Hypertension    Past Surgical History:  Procedure Laterality Date   CATARACT EXTRACTION Bilateral 08/31/1998    FAMILY HISTORY History reviewed. No pertinent family history.  SOCIAL HISTORY Social History   Tobacco Use   Smoking status: Never    Smokeless tobacco: Never         OPHTHALMIC EXAM:  Base Eye Exam     Visual Acuity (ETDRS)       Right Left   Dist cc 20/60 -1 CF at 3'   Dist ph cc 20/30 -1          Tonometry (Tonopen, 9:29 AM)       Right Left   Pressure 09 09         Pupils       Pupils Dark Light Shape React APD   Right PERRL 2 2 Round Minimal None   Left PERRL 2 2 Round Minimal None         Visual Fields       Left Right   Restrictions  Central scotoma         Extraocular Movement       Right Left    Full Full         Neuro/Psych     Oriented x3: Yes   Mood/Affect: Normal         Dilation     Both eyes: 1.0% Mydriacyl, 2.5% Phenylephrine @ 9:29 AM           Slit Lamp and Fundus Exam  External Exam       Right Left   External Normal Normal         Slit Lamp Exam       Right Left   Lids/Lashes Normal Normal   Conjunctiva/Sclera White and quiet White and quiet   Cornea Clear Clear   Anterior Chamber Deep and quiet Deep and quiet   Iris Round and reactive Round and reactive   Lens Posterior chamber intraocular lens Posterior chamber intraocular lens   Anterior Vitreous Normal Normal         Fundus Exam       Right Left   Posterior Vitreous Normal Normal   Disc Normal Normal   C/D Ratio 0.4 0.3   Macula Geographic atrophy, Retinal pigment epithelial mottling Retinal pigment epithelial atrophy, Disciform scar, no hemorrhage, Geographic atrophy, Cystoid macular edema   Vessels Normal Normal   Periphery Normal Normal            IMAGING AND PROCEDURES  Imaging and Procedures for 12/23/20  OCT, Retina - OU - Both Eyes       Right Eye Quality was good. Scan locations included subfoveal. Central Foveal Thickness: 208. Progression has been stable. Findings include abnormal foveal contour, retinal drusen , no SRF, central retinal atrophy, outer retinal atrophy, subretinal scarring, no IRF, vitreomacular adhesion .   Left Eye Quality was  good. Central Foveal Thickness: 452. Progression has been stable. Findings include abnormal foveal contour, subretinal hyper-reflective material, subretinal fluid, intraretinal fluid, disciform scar, subretinal scarring, vitreous traction, vitreomacular adhesion .   Notes  OD 6-week follow-up OD today, small region of intraretinal fluid superiorly has improved today at 5 weeks post Avastin injection, and compared to onset of lesion May 2022, repeat injection OD today OS with persistent chronic intraretinal fluid and subretinal fluid overlying disciform macular scar, no interval change, will repeat injection OS TO MAINTAIN     Intravitreal Injection, Pharmacologic Agent - OD - Right Eye       Time Out 12/23/2020. 10:31 AM. Confirmed correct patient, procedure, site, and patient consented.   Anesthesia Topical anesthesia was used. Anesthetic medications included Akten 3.5%.   Procedure Preparation included Tobramycin 0.3%, 10% betadine to eyelids, 5% betadine to ocular surface. A 30 gauge needle was used.   Injection: 2.5 mg bevacizumab 2.5 MG/0.1ML   Route: Intravitreal, Site: Right Eye   NDC: [phone], Lot: [id number]   Post-op Post injection exam found visual acuity of at least counting fingers. The patient tolerated the procedure well. There were no complications. The patient received written and verbal post procedure care education. Post injection medications included ocuflox.      Intravitreal Injection, Pharmacologic Agent - OS - Left Eye       Time Out 12/23/2020. 10:31 AM. Confirmed correct patient, procedure, site, and patient consented.   Anesthesia Topical anesthesia was used. Anesthetic medications included Akten 3.5%.   Procedure Preparation included Tobramycin 0.3%, Ofloxacin , 10% betadine to eyelids, 5% betadine to ocular surface. A supplied needle was used.   Injection: 2.5 mg bevacizumab 2.5 MG/0.1ML   Route: Intravitreal, Site: Left Eye   NDC:  [id number], Lot: [id number]   Post-op Post injection exam found visual acuity of at least counting fingers. The patient tolerated the procedure well. There were no complications. The patient received written and verbal post procedure care education. Post injection medications included ocuflox.              ASSESSMENT/PLAN:  Vitreomacular adhesion of left eye  Some component of elevation of the macula may be ongoing from this  Macular pucker, left eye OS appears minor  Exudative age-related macular degeneration of left eye with active choroidal neovascularization (HCC) Chronic active subfoveal scarring, counts are acuity.  Today at 6-week follow-up we will repeat injection simply to maintain  Exudative age-related macular degeneration of right eye with active choroidal neovascularization ([place]) Region of involvement superior to the [facility] onset May 2022, vastly improved on current therapy.  Repeat today injection Avastin and this monocular patient extend interval examination to 7 weeks     ICD-10-CM   1. Exudative age-related macular degeneration of right eye with active choroidal neovascularization (HCC)  H35.3211 OCT, Retina - OU - Both Eyes    Intravitreal Injection, Pharmacologic Agent - OD - Right Eye    bevacizumab (AVASTIN) SOSY 2.5 mg    2. Exudative age-related macular degeneration of left eye with active choroidal neovascularization (HCC)  H35.3221 OCT, Retina - OU - Both Eyes    Intravitreal Injection, Pharmacologic Agent - OS - Left Eye    bevacizumab (AVASTIN) SOSY 2.5 mg    3. Vitreomacular adhesion of left eye  H43.822     4. Macular pucker, left eye  H35.372       1.  OD, monocular acuity, improved macular anatomy post onset of new CNVM superior to the [facility] May 2022.  Currently at 6-week follow-up interval.  We will repeat injection today examination again OD in 7 weeks.  2.  OS in the past chronic disciform scarring, secondary CME overlying.  Repeat injection today  to maintain and prevent enlargement of scotoma as subretinal fluid is noted temporally.  Follow-up also in 7-week  3.  Ophthalmic Meds Ordered this visit:  Meds ordered this encounter  Medications   bevacizumab (AVASTIN) SOSY 2.5 mg   bevacizumab (AVASTIN) SOSY 2.5 mg       Return in about 7 weeks (around 02/10/2021) for DILATE OU, AVASTIN OCT, OD, OS.  There are no Patient Instructions on file for this visit.   Explained the diagnoses, plan, and follow up with the patient and they expressed understanding.  Patient expressed understanding of the importance of proper follow up care.   Clent Demark Lakynn Halvorsen M.D. Diseases & Surgery of the Retina and Vitreous Retina & Diabetic [place] 12/23/20     Abbreviations: M myopia (nearsighted); A astigmatism; H hyperopia (farsighted); P presbyopia; Mrx spectacle prescription;  CTL contact lenses; OD right eye; OS left eye; OU both eyes  XT exotropia; ET esotropia; PEK punctate epithelial keratitis; PEE punctate epithelial erosions; DES dry eye syndrome; MGD meibomian gland dysfunction; ATs artificial tears; PFAT's preservative free artificial tears; [place] nuclear sclerotic cataract; PSC posterior subcapsular cataract; ERM epi-retinal membrane; PVD posterior vitreous detachment; RD retinal detachment; DM diabetes mellitus; DR diabetic retinopathy; NPDR non-proliferative diabetic retinopathy; PDR proliferative diabetic retinopathy; CSME clinically significant macular edema; DME diabetic macular edema; dbh dot blot hemorrhages; CWS cotton wool spot; POAG primary open angle glaucoma; C/D cup-to-disc ratio; HVF humphrey visual field; GVF goldmann visual field; OCT optical coherence tomography; IOP intraocular pressure; BRVO Branch retinal vein occlusion; CRVO central retinal vein occlusion; CRAO central retinal artery occlusion; BRAO branch retinal artery occlusion; RT retinal tear; SB scleral buckle; PPV pars plana vitrectomy; VH Vitreous hemorrhage; PRP  panretinal laser photocoagulation; IVK intravitreal kenalog; VMT vitreomacular traction; MH Macular hole;  NVD neovascularization of the disc; NVE neovascularization elsewhere; AREDS age related eye disease study; ARMD age related macular degeneration; POAG primary open  angle glaucoma; EBMD epithelial/anterior basement membrane dystrophy; ACIOL anterior chamber intraocular lens; IOL intraocular lens; PCIOL posterior chamber intraocular lens; Phaco/IOL phacoemulsification with intraocular lens placement; [place] photorefractive keratectomy; LASIK laser assisted in situ keratomileusis; HTN hypertension; DM diabetes mellitus; COPD chronic obstructive pulmonary disease

## 2021-01-04 DIAGNOSIS — R7303 Prediabetes: Secondary | ICD-10-CM | POA: Diagnosis not present

## 2021-01-04 DIAGNOSIS — E782 Mixed hyperlipidemia: Secondary | ICD-10-CM | POA: Diagnosis not present

## 2021-01-04 DIAGNOSIS — H353221 Exudative age-related macular degeneration, left eye, with active choroidal neovascularization: Secondary | ICD-10-CM | POA: Diagnosis not present

## 2021-01-04 DIAGNOSIS — I10 Essential (primary) hypertension: Secondary | ICD-10-CM | POA: Diagnosis not present

## 2021-01-04 DIAGNOSIS — Z6828 Body mass index (BMI) 28.0-28.9, adult: Secondary | ICD-10-CM | POA: Diagnosis not present

## 2021-01-04 DIAGNOSIS — E042 Nontoxic multinodular goiter: Secondary | ICD-10-CM | POA: Diagnosis not present

## 2021-02-10 ENCOUNTER — Encounter (INDEPENDENT_AMBULATORY_CARE_PROVIDER_SITE_OTHER): Payer: Self-pay | Admitting: Ophthalmology

## 2021-02-10 ENCOUNTER — Other Ambulatory Visit: Payer: Self-pay

## 2021-02-10 ENCOUNTER — Ambulatory Visit (INDEPENDENT_AMBULATORY_CARE_PROVIDER_SITE_OTHER): Payer: Medicare HMO | Admitting: Ophthalmology

## 2021-02-10 DIAGNOSIS — H353211 Exudative age-related macular degeneration, right eye, with active choroidal neovascularization: Secondary | ICD-10-CM | POA: Diagnosis not present

## 2021-02-10 DIAGNOSIS — H353221 Exudative age-related macular degeneration, left eye, with active choroidal neovascularization: Secondary | ICD-10-CM

## 2021-02-10 MED ORDER — BEVACIZUMAB 2.5 MG/0.1ML IZ SOSY
2.5000 mg | PREFILLED_SYRINGE | INTRAVITREAL | Status: AC | PRN
  Administered 2021-02-10: 2.5 mg via INTRAVITREAL

## 2021-02-10 NOTE — Assessment & Plan Note (Signed)
Much improved postinjection Avastin, stabilized since May onset of CNVM.  Repeat injection today at 7-week interval and follow-up next in 8 weeks to minimize risk of atrophy progression

## 2021-02-10 NOTE — Assessment & Plan Note (Signed)
Chronic active disciform with impact on acuity OS yet stabilized at 7-week interval today concomitant with the right eye.

## 2021-02-10 NOTE — Progress Notes (Signed)
02/10/2021     CHIEF COMPLAINT Patient presents for  Chief Complaint  Patient presents with   Macular Degeneration      HISTORY OF PRESENT ILLNESS: Deborah Solomon is a 85 y.o. female who presents to the clinic today for:   HPI   History of bilateral macular degeneration with prior injections in each eye most recent 7 weeks previous examination  No interval change in vision in either eye noted by patient Last edited by Hurman Horn, MD on 02/10/2021  9:11 AM.      Referring physician: Phylliss Blakes, OD [address],  [place] [phone]  HISTORICAL INFORMATION:   Selected notes from the [place]: No current outpatient medications on file. (Ophthalmic Drugs)   No current facility-administered medications for this visit. (Ophthalmic Drugs)   Current Outpatient Medications (Other)  Medication Sig   amLODipine (NORVASC) 5 MG tablet Take 5 mg by mouth daily.   carvedilol (COREG) 25 MG tablet Take 25 mg by mouth 2 (two) times daily.   doxazosin (CARDURA) 8 MG tablet Take 12 mg by mouth at bedtime.   estradiol (ESTRACE) 0.1 MG/GM vaginal cream Place 1 g vaginally 2 (two) times a week.   rosuvastatin (CRESTOR) 10 MG tablet Take 10 mg by mouth at bedtime.   No current facility-administered medications for this visit. (Other)      REVIEW OF SYSTEMS:    ALLERGIES No Known Allergies  PAST MEDICAL HISTORY Past Medical History:  Diagnosis Date   Hypertension    Past Surgical History:  Procedure Laterality Date   CATARACT EXTRACTION Bilateral 08/31/1998    FAMILY HISTORY No family history on file.  SOCIAL HISTORY Social History   Tobacco Use   Smoking status: Never   Smokeless tobacco: Never         OPHTHALMIC EXAM:  Base Eye Exam     Visual Acuity (ETDRS)       Right Left   Dist cc 20/60 +2    Dist ph cc 20/30          Tonometry (Tonopen, 9:15 AM)       Right Left   Pressure 15 16          Pupils       Pupils APD   Right PERRL None   Left PERRL None         Visual Fields       Left Right     Full   Restrictions Partial inner superior temporal, inferior temporal, superior nasal, inferior nasal deficiencies          Extraocular Movement       Right Left    Full Full         Dilation     Both eyes: 1.0% Mydriacyl, 2.5% Phenylephrine @ 9:13 AM            IMAGING AND PROCEDURES  Imaging and Procedures for 02/10/21  OCT, Retina - OU - Both Eyes       Right Eye Quality was good. Scan locations included subfoveal. Central Foveal Thickness: 207. Progression has been stable. Findings include abnormal foveal contour, retinal drusen , no SRF, central retinal atrophy, outer retinal atrophy, subretinal scarring, no IRF, vitreomacular adhesion .   Left Eye Quality was good. Central Foveal Thickness: 438. Progression has been stable. Findings include abnormal foveal contour, subretinal hyper-reflective material, subretinal fluid, intraretinal fluid, disciform scar,  subretinal scarring, vitreous traction, vitreomacular adhesion .   Notes  OD 7-week follow-up OD today, small region of intraretinal fluid superiorly has improved today at 5 weeks post Avastin injection, and compared to onset of lesion May 2022, repeat injection OD today OS with persistent chronic intraretinal fluid and subretinal fluid overlying disciform macular scar, no interval change, will repeat injection OS TO MAINTAIN     Intravitreal Injection, Pharmacologic Agent - OD - Right Eye       Time Out 02/10/2021. 10:10 AM. Confirmed correct patient, procedure, site, and patient consented.   Anesthesia Topical anesthesia was used. Anesthetic medications included Lidocaine 4%.   Procedure Preparation included Tobramycin 0.3%, 10% betadine to eyelids, 5% betadine to ocular surface. A 30 gauge needle was used.   Injection: 2.5 mg bevacizumab 2.5 MG/0.1ML   Route: Intravitreal, Site:  Right Eye   NDC: [phone], Lot: [id number]   Post-op Post injection exam found visual acuity of at least counting fingers. The patient tolerated the procedure well. There were no complications. The patient received written and verbal post procedure care education. Post injection medications included ocuflox.      Intravitreal Injection, Pharmacologic Agent - OS - Left Eye       Time Out 02/10/2021. 10:11 AM. Confirmed correct patient, procedure, site, and patient consented.   Anesthesia Topical anesthesia was used. Anesthetic medications included Lidocaine 4%.   Procedure Preparation included Tobramycin 0.3%, Ofloxacin , 10% betadine to eyelids, 5% betadine to ocular surface. A supplied needle was used.   Injection: 2.5 mg bevacizumab 2.5 MG/0.1ML   Route: Intravitreal, Site: Left Eye   NDC: [phone], Lot: [id number]   Post-op Post injection exam found visual acuity of at least counting fingers. The patient tolerated the procedure well. There were no complications. The patient received written and verbal post procedure care education. Post injection medications included ocuflox.              ASSESSMENT/PLAN:  Exudative age-related macular degeneration of left eye with active choroidal neovascularization (HCC) Chronic active disciform with impact on acuity OS yet stabilized at 7-week interval today concomitant with the right eye.  Exudative age-related macular degeneration of right eye with active choroidal neovascularization (HCC) Much improved postinjection Avastin, stabilized since May onset of CNVM.  Repeat injection today at 7-week interval and follow-up next in 8 weeks to minimize risk of atrophy progression     ICD-10-CM   1. Exudative age-related macular degeneration of right eye with active choroidal neovascularization (HCC)  H35.3211 OCT, Retina - OU - Both Eyes    Intravitreal Injection, Pharmacologic Agent - OD - Right Eye    bevacizumab (AVASTIN) SOSY 2.5  mg    2. Exudative age-related macular degeneration of left eye with active choroidal neovascularization (HCC)  H35.3221 OCT, Retina - OU - Both Eyes    Intravitreal Injection, Pharmacologic Agent - OS - Left Eye    bevacizumab (AVASTIN) SOSY 2.5 mg      1.  OU, stabilized each eye from anatomy standpoint with permanent acuity changes left eye from subfoveal disciform scar.  2.  OD, with preserved acuity yet still risk for progression of atrophy With ongoing therapy will continue to monitor improved CNVM OD 7-week interval today post injection.  Repeat injection today and extend interval examination 8-week  3.  Ophthalmic Meds Ordered this visit:  Meds ordered this encounter  Medications   bevacizumab (AVASTIN) SOSY 2.5 mg   bevacizumab (AVASTIN) SOSY 2.5 mg  Return in about 8 weeks (around 04/07/2021) for DILATE OU, AVASTIN OCT, OD, OS.  There are no Patient Instructions on file for this visit.   Explained the diagnoses, plan, and follow up with the patient and they expressed understanding.  Patient expressed understanding of the importance of proper follow up care.   Clent Demark Sama Arauz M.D. Diseases & Surgery of the Retina and Vitreous Retina & Diabetic [place] 02/10/21     Abbreviations: M myopia (nearsighted); A astigmatism; H hyperopia (farsighted); P presbyopia; Mrx spectacle prescription;  CTL contact lenses; OD right eye; OS left eye; OU both eyes  XT exotropia; ET esotropia; PEK punctate epithelial keratitis; PEE punctate epithelial erosions; DES dry eye syndrome; MGD meibomian gland dysfunction; ATs artificial tears; PFAT's preservative free artificial tears; [place] nuclear sclerotic cataract; PSC posterior subcapsular cataract; ERM epi-retinal membrane; PVD posterior vitreous detachment; RD retinal detachment; DM diabetes mellitus; DR diabetic retinopathy; NPDR non-proliferative diabetic retinopathy; PDR proliferative diabetic retinopathy; CSME clinically significant  macular edema; DME diabetic macular edema; dbh dot blot hemorrhages; CWS cotton wool spot; POAG primary open angle glaucoma; C/D cup-to-disc ratio; HVF humphrey visual field; GVF goldmann visual field; OCT optical coherence tomography; IOP intraocular pressure; BRVO Branch retinal vein occlusion; CRVO central retinal vein occlusion; CRAO central retinal artery occlusion; BRAO branch retinal artery occlusion; RT retinal tear; SB scleral buckle; PPV pars plana vitrectomy; VH Vitreous hemorrhage; PRP panretinal laser photocoagulation; IVK intravitreal kenalog; VMT vitreomacular traction; MH Macular hole;  NVD neovascularization of the disc; NVE neovascularization elsewhere; AREDS age related eye disease study; ARMD age related macular degeneration; POAG primary open angle glaucoma; EBMD epithelial/anterior basement membrane dystrophy; ACIOL anterior chamber intraocular lens; IOL intraocular lens; PCIOL posterior chamber intraocular lens; Phaco/IOL phacoemulsification with intraocular lens placement; [place] photorefractive keratectomy; LASIK laser assisted in situ keratomileusis; HTN hypertension; DM diabetes mellitus; COPD chronic obstructive pulmonary disease

## 2021-03-03 DIAGNOSIS — Z4689 Encounter for fitting and adjustment of other specified devices: Secondary | ICD-10-CM | POA: Diagnosis not present

## 2021-03-03 DIAGNOSIS — N8111 Cystocele, midline: Secondary | ICD-10-CM | POA: Diagnosis not present

## 2021-03-03 DIAGNOSIS — N952 Postmenopausal atrophic vaginitis: Secondary | ICD-10-CM | POA: Diagnosis not present

## 2021-03-04 DIAGNOSIS — Z1331 Encounter for screening for depression: Secondary | ICD-10-CM | POA: Diagnosis not present

## 2021-03-04 DIAGNOSIS — Z0000 Encounter for general adult medical examination without abnormal findings: Secondary | ICD-10-CM | POA: Diagnosis not present

## 2021-03-04 DIAGNOSIS — Z9181 History of falling: Secondary | ICD-10-CM | POA: Diagnosis not present

## 2021-03-04 DIAGNOSIS — E785 Hyperlipidemia, unspecified: Secondary | ICD-10-CM | POA: Diagnosis not present

## 2021-03-23 DIAGNOSIS — E782 Mixed hyperlipidemia: Secondary | ICD-10-CM | POA: Diagnosis not present

## 2021-03-23 DIAGNOSIS — R7303 Prediabetes: Secondary | ICD-10-CM | POA: Diagnosis not present

## 2021-04-07 ENCOUNTER — Other Ambulatory Visit: Payer: Self-pay

## 2021-04-07 ENCOUNTER — Ambulatory Visit (INDEPENDENT_AMBULATORY_CARE_PROVIDER_SITE_OTHER): Payer: Medicare HMO | Admitting: Ophthalmology

## 2021-04-07 ENCOUNTER — Encounter (INDEPENDENT_AMBULATORY_CARE_PROVIDER_SITE_OTHER): Payer: Self-pay | Admitting: Ophthalmology

## 2021-04-07 DIAGNOSIS — H43822 Vitreomacular adhesion, left eye: Secondary | ICD-10-CM

## 2021-04-07 DIAGNOSIS — H353211 Exudative age-related macular degeneration, right eye, with active choroidal neovascularization: Secondary | ICD-10-CM

## 2021-04-07 DIAGNOSIS — H35372 Puckering of macula, left eye: Secondary | ICD-10-CM

## 2021-04-07 DIAGNOSIS — H353221 Exudative age-related macular degeneration, left eye, with active choroidal neovascularization: Secondary | ICD-10-CM | POA: Diagnosis not present

## 2021-04-07 MED ORDER — BEVACIZUMAB 2.5 MG/0.1ML IZ SOSY
2.5000 mg | PREFILLED_SYRINGE | INTRAVITREAL | Status: AC | PRN
  Administered 2021-04-07: 10:00:00 2.5 mg via INTRAVITREAL

## 2021-04-07 NOTE — Assessment & Plan Note (Signed)
OD, vastly improved compared to onset of treatment May 2022.  Preserved acuity.  We will repeat injection today at 8-week interval follow-up next in 9 weeks

## 2021-04-07 NOTE — Assessment & Plan Note (Signed)
Chronic active lesion with intraretinal fluid vision limited by disciform scar yet still much less activity superior to macula secondary to treatment with antivegF currently at 8-week interval.  Repeat injection today

## 2021-04-07 NOTE — Assessment & Plan Note (Signed)
Severe VMT particularly superior portion of macula

## 2021-04-07 NOTE — Assessment & Plan Note (Signed)
OS, severe epiretinal membrane but vision limited by outer disciform scar, will need to be observed

## 2021-04-07 NOTE — Progress Notes (Signed)
04/07/2021     CHIEF COMPLAINT Patient presents for  Chief Complaint  Patient presents with   Retina Follow Up      HISTORY OF PRESENT ILLNESS: Deborah Solomon is a 86 y.o. female who presents to the clinic today for:   HPI     Retina Follow Up           Diagnosis: Wet AMD   Laterality: both eyes   Onset: 8 weeks ago   Severity: mild   Duration: 8 weeks   Course: gradually improving         Comments   8 week fu ou and oct and avastin OU  Pt states VA OU stable since last visit. Pt denies FOL, floaters, or ocular pain OU.  Pt states, "I feel like I am seeing better since I started getting shots in the right eye too."        Last edited by Kendra Opitz, COA on 04/07/2021  9:07 AM.      Referring physician: [facility], [place],  [place] [phone]  HISTORICAL INFORMATION:   Selected notes from the [place] [facility]: No current outpatient medications on file. (Ophthalmic Drugs)   No current facility-administered medications for this visit. (Ophthalmic Drugs)   Current Outpatient Medications (Other)  Medication Sig   amLODipine (NORVASC) 5 MG tablet Take 5 mg by mouth daily.   carvedilol (COREG) 25 MG tablet Take 25 mg by mouth 2 (two) times daily.   doxazosin (CARDURA) 8 MG tablet Take 12 mg by mouth at bedtime.   estradiol (ESTRACE) 0.1 MG/GM vaginal cream Place 1 g vaginally 2 (two) times a week.   rosuvastatin (CRESTOR) 10 MG tablet Take 10 mg by mouth at bedtime.   No current facility-administered medications for this visit. (Other)      REVIEW OF SYSTEMS:    ALLERGIES No Known Allergies  PAST MEDICAL HISTORY Past Medical History:  Diagnosis Date   Hypertension    Past Surgical History:  Procedure Laterality Date   CATARACT EXTRACTION Bilateral 08/31/1998    FAMILY HISTORY History reviewed. No pertinent family history.  SOCIAL HISTORY Social History    Tobacco Use   Smoking status: Never   Smokeless tobacco: Never         OPHTHALMIC EXAM:  Base Eye Exam     Visual Acuity (ETDRS)       Right Left   Dist cc 20/40 -1 CF at 3'   Dist ph cc 20/30 NI    Correction: Glasses         Tonometry (Tonopen, 9:11 AM)       Right Left   Pressure 10 12         Pupils       Pupils Dark Light Shape React APD   Right PERRL 3 3 Round Minimal None   Left PERRL 3 3 Round Minimal None         Visual Fields       Left Right     Full   Restrictions Partial inner superior temporal, inferior temporal, superior nasal, inferior nasal deficiencies          Extraocular Movement       Right Left    Full, Ortho Full, Ortho         Neuro/Psych     Oriented x3: Yes   Mood/Affect: Normal  Dilation     Both eyes: 1.0% Mydriacyl, 2.5% Phenylephrine @ 9:11 AM           Slit Lamp and Fundus Exam     External Exam       Right Left   External Normal Normal         Slit Lamp Exam       Right Left   Lids/Lashes Normal Normal   Conjunctiva/Sclera White and quiet White and quiet   Cornea Clear Clear   Anterior Chamber Deep and quiet Deep and quiet   Iris Round and reactive Round and reactive   Lens Posterior chamber intraocular lens Posterior chamber intraocular lens   Anterior Vitreous Normal Normal         Fundus Exam       Right Left   Posterior Vitreous Normal Normal   Disc Normal Normal   C/D Ratio 0.4 0.3   Macula Geographic atrophy, Retinal pigment epithelial mottling Retinal pigment epithelial atrophy, Disciform scar, no hemorrhage, Geographic atrophy, Cystoid macular edema   Vessels Normal Normal   Periphery Normal Normal            IMAGING AND PROCEDURES  Imaging and Procedures for 04/07/21  OCT, Retina - OU - Both Eyes       Right Eye Quality was good. Scan locations included subfoveal. Central Foveal Thickness: 211. Progression has been stable. Findings include  abnormal foveal contour, retinal drusen , no SRF, central retinal atrophy, outer retinal atrophy, subretinal scarring, no IRF, vitreomacular adhesion .   Left Eye Quality was good. Central Foveal Thickness: 448. Progression has been stable. Findings include abnormal foveal contour, subretinal hyper-reflective material, subretinal fluid, intraretinal fluid, disciform scar, subretinal scarring, vitreous traction, vitreomacular adhesion .   Notes  OD 8-week follow-up OD today, small region of intraretinal fluid superiorly has improved today8 weeks post Avastin injection, and compared to onset of lesion May 2022, repeat injection OD today OS with persistent chronic intraretinal fluid and subretinal fluid overlying disciform macular scar, no interval change, will repeat injection OS TO MAINTAIN also at 8-week follow-up today       Intravitreal Injection, Pharmacologic Agent - OD - Right Eye       Time Out 04/07/2021. 10:07 AM. Confirmed correct patient, procedure, site, and patient consented.   Anesthesia Topical anesthesia was used. Anesthetic medications included Lidocaine 4%.   Procedure Preparation included Tobramycin 0.3%, 10% betadine to eyelids, 5% betadine to ocular surface. A 30 gauge needle was used.   Injection: 2.5 mg bevacizumab 2.5 MG/0.1ML   Route: Intravitreal, Site: Right Eye   NDC: [phone], Lot: [id number]   Post-op Post injection exam found visual acuity of at least counting fingers. The patient tolerated the procedure well. There were no complications. The patient received written and verbal post procedure care education. Post injection medications included ocuflox.      Intravitreal Injection, Pharmacologic Agent - OS - Left Eye       Time Out 04/07/2021. 10:06 AM. Confirmed correct patient, procedure, site, and patient consented.   Anesthesia Topical anesthesia was used. Anesthetic medications included Lidocaine 4%.   Procedure Preparation included  Tobramycin 0.3%, 10% betadine to eyelids, 5% betadine to ocular surface. A supplied needle was used.   Injection: 2.5 mg bevacizumab 2.5 MG/0.1ML   Route: Intravitreal, Site: Left Eye   NDC: [phone], Lot: [id number]   Post-op Post injection exam found visual acuity of at least counting fingers. The patient tolerated the procedure well. There were no  complications. The patient received written and verbal post procedure care education. Post injection medications included ocuflox.              ASSESSMENT/PLAN:  Exudative age-related macular degeneration of left eye with active choroidal neovascularization (HCC) Chronic active lesion with intraretinal fluid vision limited by disciform scar yet still much less activity superior to macula secondary to treatment with antivegF currently at 8-week interval.  Repeat injection today  Exudative age-related macular degeneration of right eye with active choroidal neovascularization ([place]) OD, vastly improved compared to onset of treatment May 2022.  Preserved acuity.  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 Exudative age-related macular degeneration of right eye with active choroidal neovascularization (HCC)  H35.3211 OCT, Retina - OU - Both Eyes    Intravitreal Injection, Pharmacologic Agent - OD - Right Eye    bevacizumab (AVASTIN) SOSY 2.5 mg    2. Exudative age-related macular degeneration of left eye with active choroidal neovascularization (HCC)  H35.3221 OCT, Retina - OU - Both Eyes    Intravitreal Injection, Pharmacologic Agent - OS - Left Eye    bevacizumab (AVASTIN) SOSY 2.5 mg    3. Macular pucker, left eye  H35.372     4. Vitreomacular adhesion of left eye   H43.822       1.  OU vastly improved macular condition post injections of antivegF currently maintained and stable at 8-week follow-up with preserved acuity OD in fact improved acuity OD.  2.  Repeat injection OU today follow-up next in 9 weeks  3.  Ophthalmic Meds Ordered this visit:  Meds ordered this encounter  Medications   bevacizumab (AVASTIN) SOSY 2.5 mg   bevacizumab (AVASTIN) SOSY 2.5 mg       Return in about 9 weeks (around 06/09/2021) for DILATE OU, AVASTIN OCT, OS, OD.  There are no Patient Instructions on file for this visit.   Explained the diagnoses, plan, and follow up with the patient and they expressed understanding.  Patient expressed understanding of the importance of proper follow up care.   Clent Demark Kazoua Gossen M.D. Diseases & Surgery of the Retina and Vitreous Retina & Diabetic [place] 04/07/21     Abbreviations: M myopia (nearsighted); A astigmatism; H hyperopia (farsighted); P presbyopia; Mrx spectacle prescription;  CTL contact lenses; OD right eye; OS left eye; OU both eyes  XT exotropia; ET esotropia; PEK punctate epithelial keratitis; PEE punctate epithelial erosions; DES dry eye syndrome; MGD meibomian gland dysfunction; ATs artificial tears; PFAT's preservative free artificial tears; [place] nuclear sclerotic cataract; PSC posterior subcapsular cataract; ERM epi-retinal membrane; PVD posterior vitreous detachment; RD retinal detachment; DM diabetes mellitus; DR diabetic retinopathy; NPDR non-proliferative diabetic retinopathy; PDR proliferative diabetic retinopathy; CSME clinically significant macular edema; DME diabetic macular edema; dbh dot blot hemorrhages; CWS cotton wool spot; POAG primary open angle glaucoma; C/D cup-to-disc ratio; HVF humphrey visual field; GVF goldmann visual field; OCT optical coherence tomography; IOP intraocular pressure; BRVO Branch retinal vein occlusion; CRVO central retinal vein occlusion; CRAO central retinal artery  occlusion; BRAO branch retinal artery occlusion; RT retinal tear; SB scleral buckle; PPV pars plana vitrectomy; VH Vitreous hemorrhage; PRP panretinal laser photocoagulation; IVK intravitreal kenalog; VMT vitreomacular traction; MH Macular hole;  NVD  neovascularization of the disc; NVE neovascularization elsewhere; AREDS age related eye disease study; ARMD age related macular degeneration; POAG primary open angle glaucoma; EBMD epithelial/anterior basement membrane dystrophy; ACIOL anterior chamber intraocular lens; IOL intraocular lens; PCIOL posterior chamber intraocular lens; Phaco/IOL phacoemulsification with intraocular lens placement; [place] photorefractive keratectomy; LASIK laser assisted in situ keratomileusis; HTN hypertension; DM diabetes mellitus; COPD chronic obstructive pulmonary disease

## 2021-05-14 DIAGNOSIS — E663 Overweight: Secondary | ICD-10-CM | POA: Diagnosis not present

## 2021-05-14 DIAGNOSIS — Z6829 Body mass index (BMI) 29.0-29.9, adult: Secondary | ICD-10-CM | POA: Diagnosis not present

## 2021-05-14 DIAGNOSIS — H3530 Unspecified macular degeneration: Secondary | ICD-10-CM | POA: Diagnosis not present

## 2021-05-14 DIAGNOSIS — K219 Gastro-esophageal reflux disease without esophagitis: Secondary | ICD-10-CM | POA: Diagnosis not present

## 2021-05-14 DIAGNOSIS — J309 Allergic rhinitis, unspecified: Secondary | ICD-10-CM | POA: Diagnosis not present

## 2021-05-14 DIAGNOSIS — N898 Other specified noninflammatory disorders of vagina: Secondary | ICD-10-CM | POA: Diagnosis not present

## 2021-05-14 DIAGNOSIS — I10 Essential (primary) hypertension: Secondary | ICD-10-CM | POA: Diagnosis not present

## 2021-05-14 DIAGNOSIS — Z720 Tobacco use: Secondary | ICD-10-CM | POA: Diagnosis not present

## 2021-05-14 DIAGNOSIS — H547 Unspecified visual loss: Secondary | ICD-10-CM | POA: Diagnosis not present

## 2021-05-14 DIAGNOSIS — R32 Unspecified urinary incontinence: Secondary | ICD-10-CM | POA: Diagnosis not present

## 2021-05-14 DIAGNOSIS — Z008 Encounter for other general examination: Secondary | ICD-10-CM | POA: Diagnosis not present

## 2021-05-14 DIAGNOSIS — M1990 Unspecified osteoarthritis, unspecified site: Secondary | ICD-10-CM | POA: Diagnosis not present

## 2021-05-14 DIAGNOSIS — E785 Hyperlipidemia, unspecified: Secondary | ICD-10-CM | POA: Diagnosis not present

## 2021-06-10 ENCOUNTER — Encounter (INDEPENDENT_AMBULATORY_CARE_PROVIDER_SITE_OTHER): Payer: Medicare HMO | Admitting: Ophthalmology

## 2021-06-17 ENCOUNTER — Encounter (INDEPENDENT_AMBULATORY_CARE_PROVIDER_SITE_OTHER): Payer: Medicare HMO | Admitting: Ophthalmology

## 2021-06-17 ENCOUNTER — Other Ambulatory Visit: Payer: Self-pay

## 2021-06-17 ENCOUNTER — Ambulatory Visit (INDEPENDENT_AMBULATORY_CARE_PROVIDER_SITE_OTHER): Payer: Medicare HMO | Admitting: Ophthalmology

## 2021-06-17 ENCOUNTER — Encounter (INDEPENDENT_AMBULATORY_CARE_PROVIDER_SITE_OTHER): Payer: Self-pay | Admitting: Ophthalmology

## 2021-06-17 DIAGNOSIS — H35372 Puckering of macula, left eye: Secondary | ICD-10-CM

## 2021-06-17 DIAGNOSIS — H353211 Exudative age-related macular degeneration, right eye, with active choroidal neovascularization: Secondary | ICD-10-CM | POA: Diagnosis not present

## 2021-06-17 DIAGNOSIS — H43821 Vitreomacular adhesion, right eye: Secondary | ICD-10-CM | POA: Diagnosis not present

## 2021-06-17 DIAGNOSIS — H353221 Exudative age-related macular degeneration, left eye, with active choroidal neovascularization: Secondary | ICD-10-CM | POA: Diagnosis not present

## 2021-06-17 DIAGNOSIS — H43822 Vitreomacular adhesion, left eye: Secondary | ICD-10-CM | POA: Diagnosis not present

## 2021-06-17 MED ORDER — BEVACIZUMAB 2.5 MG/0.1ML IZ SOSY
2.5000 mg | PREFILLED_SYRINGE | INTRAVITREAL | Status: AC | PRN
  Administered 2021-06-17: 2.5 mg via INTRAVITREAL

## 2021-06-17 NOTE — Assessment & Plan Note (Signed)
Follow-up today at 10 weeks.  Doing very well with less active CNVM superior to FAZ as compared to 1 year previous onset. ? ?Repeat injection today and maintain 10-week follow-up in this monocular patient ?

## 2021-06-17 NOTE — Assessment & Plan Note (Signed)
Minor not impactful on acuity OS ?

## 2021-06-17 NOTE — Assessment & Plan Note (Signed)
Physiologic OD 

## 2021-06-17 NOTE — Assessment & Plan Note (Signed)
Physiologic OS 

## 2021-06-17 NOTE — Assessment & Plan Note (Signed)
Large subfoveal disciform scar, still with less fluid and less edema at 10 weeks post most recent injection, repeat injection today to prevent scotoma enlargement ?

## 2021-06-17 NOTE — Progress Notes (Signed)
? ? ?06/17/2021 ? ?  ? ?CHIEF COMPLAINT ?Patient presents for  ?Chief Complaint  ?Patient presents with  ? Macular Degeneration  ? ? ? ? ?HISTORY OF PRESENT ILLNESS: ?Deborah Solomon is a 86 y.o. female who presents to the clinic today for:  ? ?HPI   ?9 weeks dilate OU, Avastin OCT, OS, OD. ?Patient states vision is stable and unchanged since last visit. Denies any new floaters or FOL. ?Pt asks "I want to ask Dr. Zadie Rhine how my right eye is doing." ?Pt takes PreserVision AREDS twice a day. ?Last edited by Laurin Coder on 06/17/2021  8:38 AM.  ?  ? ? ?Referring physician: ?Practice, [facility] ?[place],  [place] [id number] ? ?HISTORICAL INFORMATION:  ? ?Selected notes from the [place] ?  ?   ? ?CURRENT MEDICATIONS: ?No current outpatient medications on file. (Ophthalmic Drugs)  ? ?No current facility-administered medications for this visit. (Ophthalmic Drugs)  ? ?Current Outpatient Medications (Other)  ?Medication Sig  ? amLODipine (NORVASC) 5 MG tablet Take 5 mg by mouth daily.  ? carvedilol (COREG) 25 MG tablet Take 25 mg by mouth 2 (two) times daily.  ? doxazosin (CARDURA) 8 MG tablet Take 12 mg by mouth at bedtime.  ? estradiol (ESTRACE) 0.1 MG/GM vaginal cream Place 1 g vaginally 2 (two) times a week.  ? rosuvastatin (CRESTOR) 10 MG tablet Take 10 mg by mouth at bedtime.  ? ?No current facility-administered medications for this visit. (Other)  ? ? ? ? ?REVIEW OF SYSTEMS: ?ROS   ?Negative for: Constitutional, Gastrointestinal, Neurological, Skin, Genitourinary, Musculoskeletal, HENT, Endocrine, Cardiovascular, Eyes, Respiratory, Psychiatric, Allergic/Imm, Heme/Lymph ?Last edited by Hurman Horn, MD on 06/17/2021  9:23 AM.  ?  ? ? ? ?ALLERGIES ?No Known Allergies ? ?PAST MEDICAL HISTORY ?Past Medical History:  ?Diagnosis Date  ? Hypertension   ? ?Past Surgical History:  ?Procedure Laterality Date  ? CATARACT EXTRACTION Bilateral 08/31/1998  ? ? ?FAMILY HISTORY ?History  reviewed. No pertinent family history. ? ?SOCIAL HISTORY ?Social History  ? ?Tobacco Use  ? Smoking status: Never  ? Smokeless tobacco: Never  ? ?  ? ?  ? ?OPHTHALMIC EXAM: ? ?Base Eye Exam   ? ? Visual Acuity (ETDRS)   ? ?   Right Left  ? Dist cc 20/30 -1 CF at 3'  ? Dist ph cc  NI  ? ? Correction: Glasses  ? ?  ?  ? ? Tonometry (Tonopen, 8:38 AM)   ? ?   Right Left  ? Pressure 4 6  ? ?  ?  ? ? Pupils   ? ?   Pupils React APD  ? Right PERRL Minimal None  ? Left PERRL Minimal None  ? ?  ?  ? ? Visual Fields   ? ?   Left Right  ?  Full   ? ?  ?  ? ? Extraocular Movement   ? ?   Right Left  ?  Full Full  ? ?  ?  ? ? Neuro/Psych   ? ? Oriented x3: Yes  ? Mood/Affect: Normal  ? ?  ?  ? ? Dilation   ? ? Both eyes: 1.0% Mydriacyl, 2.5% Phenylephrine @ 8:38 AM  ? ?  ?  ? ?  ? ?Slit Lamp and Fundus Exam   ? ? External Exam   ? ?   Right Left  ? External Normal Normal  ? ?  ?  ? ?  Slit Lamp Exam   ? ?   Right Left  ? Lids/Lashes Normal Normal  ? Conjunctiva/Sclera White and quiet White and quiet  ? Cornea Clear Clear  ? Anterior Chamber Deep and quiet Deep and quiet  ? Iris Round and reactive Round and reactive  ? Lens Posterior chamber intraocular lens Posterior chamber intraocular lens  ? Anterior Vitreous Normal Normal  ? ?  ?  ? ? Fundus Exam   ? ?   Right Left  ? Posterior Vitreous Normal Normal  ? Disc Normal Normal  ? C/D Ratio 0.4 0.3  ? Macula Geographic atrophy, Retinal pigment epithelial mottling Retinal pigment epithelial atrophy, Disciform scar, no hemorrhage, Geographic atrophy, Cystoid macular edema  ? Vessels Normal Normal  ? Periphery Normal Normal  ? ?  ?  ? ?  ? ? ?IMAGING AND PROCEDURES  ?Imaging and Procedures for 06/17/21 ? ?Intravitreal Injection, Pharmacologic Agent - OD - Right Eye   ? ?   ?Time Out ?06/17/2021. 9:23 AM. Confirmed correct patient, procedure, site, and patient consented.  ? ?Anesthesia ?Topical anesthesia was used. Anesthetic medications included Lidocaine 4%.  ? ?Procedure ?Preparation  included Tobramycin 0.3%, 10% betadine to eyelids, 5% betadine to ocular surface. A 30 gauge needle was used.  ? ?Injection: ?2.5 mg bevacizumab 2.5 MG/0.1ML ?  Route: Intravitreal, Site: Right Eye ?  [place]: [id number], Lot: [id number]  ? ?Post-op ?Post injection exam found visual acuity of at least counting fingers. The patient tolerated the procedure well. There were no complications. The patient received written and verbal post procedure care education. Post injection medications included ocuflox.  ? ?  ? ?OCT, Retina - OU - Both Eyes   ? ?   ?Right Eye ?Quality was good. Scan locations included subfoveal. Central Foveal Thickness: 207. Progression has been stable. Findings include abnormal foveal contour, retinal drusen , no SRF, central retinal atrophy, outer retinal atrophy, subretinal scarring, no IRF, vitreomacular adhesion .  ? ?Left Eye ?Quality was good. Central Foveal Thickness: 438. Progression has been stable. Findings include abnormal foveal contour, subretinal hyper-reflective material, subretinal fluid, intraretinal fluid, disciform scar, subretinal scarring, vitreous traction, vitreomacular adhesion .  ? ?Notes ? OD 9-week follow-up OD today, small region of intraretinal fluid superiorly has improved today 9 weeks post Avastin injection, and compared to onset of lesion May 2022, repeat injection OD today ?OS with persistent chronic intraretinal fluid and subretinal fluid overlying disciform macular scar, no interval change, will repeat injection OS TO MAINTAIN also at 9-week follow-up today ? ? ? ?  ? ?Intravitreal Injection, Pharmacologic Agent - OS - Left Eye   ? ?   ?Time Out ?06/17/2021. 9:24 AM. Confirmed correct patient, procedure, site, and patient consented.  ? ?Anesthesia ?Topical anesthesia was used. Anesthetic medications included Lidocaine 4%.  ? ?Procedure ?Preparation included Tobramycin 0.3%, 10% betadine to eyelids, 5% betadine to ocular surface. A supplied needle was used.   ? ?Injection: ?2.5 mg bevacizumab 2.5 MG/0.1ML ?  Route: Intravitreal, Site: Left Eye ?  [place]: [id number], Lot: [id number]  ? ?Post-op ?Post injection exam found visual acuity of at least counting fingers. The patient tolerated the procedure well. There were no complications. The patient received written and verbal post procedure care education. Post injection medications included ocuflox.  ? ?  ? ? ?  ?  ? ?  ?ASSESSMENT/PLAN: ? ?Exudative age-related macular degeneration of right eye with active choroidal neovascularization ([place]) ?Follow-up today at 10 weeks.  Doing very well with less active  CNVM superior to FAZ as compared to 1 year previous onset. ? ?Repeat injection today and maintain 10-week follow-up in this monocular patient ? ?Vitreomacular adhesion of right eye ?Physiologic OD ? ?Vitreomacular adhesion of left eye ?Physiologic OS ? ?Macular pucker, left eye ?Minor not impactful on acuity OS ? ?Exudative age-related macular degeneration of left eye with active choroidal neovascularization ([place]) ?Large subfoveal disciform scar, still with less fluid and less edema at 10 weeks post most recent injection, repeat injection today to prevent scotoma enlargement ?  ? ?  ICD-10-CM   ?1. Exudative age-related macular degeneration of right eye with active choroidal neovascularization (HCC)  H35.3211 Intravitreal Injection, Pharmacologic Agent - OD - Right Eye  ?  OCT, Retina - OU - Both Eyes  ?  bevacizumab (AVASTIN) SOSY 2.5 mg  ?  ?2. Exudative age-related macular degeneration of left eye with active choroidal neovascularization (HCC)  H35.3221 OCT, Retina - OU - Both Eyes  ?  Intravitreal Injection, Pharmacologic Agent - OS - Left Eye  ?  bevacizumab (AVASTIN) SOSY 2.5 mg  ?  CANCELED: OCT, Retina - OS - Left Eye  ?  ?3. Vitreomacular adhesion of right eye  H43.821   ?  ?4. Vitreomacular adhesion of left eye  H43.822   ?  ?5. Macular pucker, left eye  H35.372   ?  ? ? ?1. ? ?2. ? ?3. ? ?Ophthalmic Meds Ordered  this visit:  ?Meds ordered this encounter  ?Medications  ? bevacizumab (AVASTIN) SOSY 2.5 mg  ? bevacizumab (AVASTIN) SOSY 2.5 mg  ? ? ?  ? ?Return in about 10 weeks (around 08/26/2021) for DILATE OU, COLOR FP, AVASTIN OCT,

## 2021-07-07 DIAGNOSIS — R7303 Prediabetes: Secondary | ICD-10-CM | POA: Diagnosis not present

## 2021-07-07 DIAGNOSIS — H353221 Exudative age-related macular degeneration, left eye, with active choroidal neovascularization: Secondary | ICD-10-CM | POA: Diagnosis not present

## 2021-07-07 DIAGNOSIS — H6192 Disorder of left external ear, unspecified: Secondary | ICD-10-CM | POA: Diagnosis not present

## 2021-07-07 DIAGNOSIS — I10 Essential (primary) hypertension: Secondary | ICD-10-CM | POA: Diagnosis not present

## 2021-07-07 DIAGNOSIS — E782 Mixed hyperlipidemia: Secondary | ICD-10-CM | POA: Diagnosis not present

## 2021-07-07 DIAGNOSIS — E042 Nontoxic multinodular goiter: Secondary | ICD-10-CM | POA: Diagnosis not present

## 2021-08-12 DIAGNOSIS — N8111 Cystocele, midline: Secondary | ICD-10-CM | POA: Diagnosis not present

## 2021-08-12 DIAGNOSIS — Z4689 Encounter for fitting and adjustment of other specified devices: Secondary | ICD-10-CM | POA: Diagnosis not present

## 2021-08-26 ENCOUNTER — Ambulatory Visit (INDEPENDENT_AMBULATORY_CARE_PROVIDER_SITE_OTHER): Payer: Medicare HMO | Admitting: Ophthalmology

## 2021-08-26 ENCOUNTER — Encounter (INDEPENDENT_AMBULATORY_CARE_PROVIDER_SITE_OTHER): Payer: Self-pay | Admitting: Ophthalmology

## 2021-08-26 DIAGNOSIS — H353221 Exudative age-related macular degeneration, left eye, with active choroidal neovascularization: Secondary | ICD-10-CM | POA: Diagnosis not present

## 2021-08-26 DIAGNOSIS — H35352 Cystoid macular degeneration, left eye: Secondary | ICD-10-CM

## 2021-08-26 DIAGNOSIS — H43822 Vitreomacular adhesion, left eye: Secondary | ICD-10-CM

## 2021-08-26 DIAGNOSIS — H353211 Exudative age-related macular degeneration, right eye, with active choroidal neovascularization: Secondary | ICD-10-CM | POA: Diagnosis not present

## 2021-08-26 DIAGNOSIS — H353124 Nonexudative age-related macular degeneration, left eye, advanced atrophic with subfoveal involvement: Secondary | ICD-10-CM

## 2021-08-26 DIAGNOSIS — H35372 Puckering of macula, left eye: Secondary | ICD-10-CM | POA: Diagnosis not present

## 2021-08-26 MED ORDER — BEVACIZUMAB 2.5 MG/0.1ML IZ SOSY
2.5000 mg | PREFILLED_SYRINGE | INTRAVITREAL | Status: AC | PRN
  Administered 2021-08-26: 2.5 mg via INTRAVITREAL

## 2021-08-26 NOTE — Assessment & Plan Note (Signed)
Some impact on the macular trial retinal structures with CME.  In combination with the ERM.

## 2021-08-26 NOTE — Assessment & Plan Note (Signed)
Significant topographic distortion yet outer retinal scarring may limit acuity

## 2021-08-26 NOTE — Assessment & Plan Note (Signed)
May limit acuity OS

## 2021-08-26 NOTE — Assessment & Plan Note (Addendum)
Secondary to CNVM and ERM and VMA

## 2021-08-26 NOTE — Progress Notes (Signed)
08/26/2021     CHIEF COMPLAINT Patient presents for  Chief Complaint  Patient presents with   Macular Degeneration      HISTORY OF PRESENT ILLNESS: Deborah Solomon is a 86 y.o. female who presents to the clinic today for:   HPI   10 weeks  for DILATE OU, COLOR FP, AVASTIN OCT, OD, OS. Pt stated vision has improved since last visit. Pt reports no longer seeing the big brown spot in the left eye for the past couple of days.  Last edited by Silvestre Moment on 08/26/2021  8:41 AM.      Referring physician: Practice, [place],  [place] [phone]  HISTORICAL INFORMATION:   Selected notes from the MEDICAL RECORD NUMBER       CURRENT MEDICATIONS: No current outpatient medications on file. (Ophthalmic Drugs)   No current facility-administered medications for this visit. (Ophthalmic Drugs)   Current Outpatient Medications (Other)  Medication Sig   amLODipine (NORVASC) 5 MG tablet Take 5 mg by mouth daily.   carvedilol (COREG) 25 MG tablet Take 25 mg by mouth 2 (two) times daily.   doxazosin (CARDURA) 8 MG tablet Take 12 mg by mouth at bedtime.   estradiol (ESTRACE) 0.1 MG/GM vaginal cream Place 1 g vaginally 2 (two) times a week.   rosuvastatin (CRESTOR) 10 MG tablet Take 10 mg by mouth at bedtime.   No current facility-administered medications for this visit. (Other)      REVIEW OF SYSTEMS: ROS   Negative for: Constitutional, Gastrointestinal, Neurological, Skin, Genitourinary, Musculoskeletal, HENT, Endocrine, Cardiovascular, Eyes, Respiratory, Psychiatric, Allergic/Imm, Heme/Lymph Last edited by Silvestre Moment on 08/26/2021  8:41 AM.       ALLERGIES No Known Allergies  PAST MEDICAL HISTORY Past Medical History:  Diagnosis Date   Hypertension    Past Surgical History:  Procedure Laterality Date   CATARACT EXTRACTION Bilateral 08/31/1998    FAMILY HISTORY No family history on file.  SOCIAL HISTORY Social History   Tobacco Use    Smoking status: Never   Smokeless tobacco: Never         OPHTHALMIC EXAM:  Base Eye Exam     Visual Acuity (ETDRS)       Right Left   Dist cc 20/40 CF at 2'   Dist ph cc 20/30 -2     Correction: Glasses         Tonometry (Tonopen, 8:46 AM)       Right Left   Pressure 12 11         Pupils       Pupils APD   Right PERRL None   Left PERRL None         Visual Fields       Left Right    Full Full         Extraocular Movement       Right Left    Full Full         Neuro/Psych     Oriented x3: Yes   Mood/Affect: Normal         Dilation     Both eyes: 1.0% Mydriacyl, 2.5% Phenylephrine @ 8:46 AM           Slit Lamp and Fundus Exam     External Exam       Right Left   External Normal Normal         Slit Lamp Exam  Right Left   Lids/Lashes Normal Normal   Conjunctiva/Sclera White and quiet White and quiet   Cornea Clear Clear   Anterior Chamber Deep and quiet Deep and quiet   Iris Round and reactive Round and reactive   Lens Posterior chamber intraocular lens Posterior chamber intraocular lens   Anterior Vitreous Normal Normal         Fundus Exam       Right Left   Posterior Vitreous Normal Normal   Disc Normal Normal   C/D Ratio 0.4 0.3   Macula Geographic atrophy, Retinal pigment epithelial mottling Retinal pigment epithelial atrophy, Disciform scar, no hemorrhage, Geographic atrophy, Cystoid macular edema   Vessels Normal Normal   Periphery Normal Normal            IMAGING AND PROCEDURES  Imaging and Procedures for 08/26/21  OCT, Retina - OU - Both Eyes       Right Eye Progression has improved. Findings include outer retinal atrophy, vitreomacular adhesion .   Left Eye Central Foveal Thickness: 428. Progression has improved. Findings include cystoid macular edema, disciform scar, epiretinal membrane, outer retinal atrophy, vitreomacular adhesion .      Intravitreal Injection, Pharmacologic  Agent - OD - Right Eye       Time Out 08/26/2021. 9:30 AM. Confirmed correct patient, procedure, site, and patient consented.   Anesthesia Topical anesthesia was used. Anesthetic medications included Lidocaine 4%.   Procedure Preparation included 5% betadine to ocular surface, 10% betadine to eyelids, Tobramycin 0.3%. A 30 gauge needle was used.   Injection: 2.5 mg bevacizumab 2.5 MG/0.1ML   Route: Intravitreal, Site: Right Eye   [place]: [phone], Lot: [id number], Expiration date: 10/10/2021   Post-op Post injection exam found visual acuity of at least counting fingers. The patient tolerated the procedure well. There were no complications. The patient received written and verbal post procedure care education. Post injection medications included ocuflox.      Intravitreal Injection, Pharmacologic Agent - OS - Left Eye       Time Out 08/26/2021. 9:30 AM. Confirmed correct patient, procedure, site, and patient consented.   Procedure Injection: 2.5 mg bevacizumab 2.5 MG/0.1ML   Route: Intravitreal, Site: Left Eye   [place]: [phone], Lot: [id number], Expiration date: 10/10/2021   Post-op Post injection exam found visual acuity of at least counting fingers. The patient tolerated the procedure well. There were no complications. The patient received written and verbal post procedure care education. Post injection medications included ocuflox.              ASSESSMENT/PLAN:  Vitreomacular adhesion of left eye Some impact on the macular trial retinal structures with CME.  In combination with the ERM.  Macular pucker, left eye Significant topographic distortion yet outer retinal scarring may limit acuity  Advanced nonexudative age-related macular degeneration of left eye with subfoveal involvement May limit acuity OS  Cystoid macular edema of left eye Secondary to CNVM and ERM and VMA      ICD-10-CM   1. Exudative age-related macular degeneration of left eye with active  choroidal neovascularization (HCC)  H35.3221 OCT, Retina - OU - Both Eyes    Intravitreal Injection, Pharmacologic Agent - OS - Left Eye    bevacizumab (AVASTIN) SOSY 2.5 mg    CANCELED: Color Fundus Photography Optos - OU - Both Eyes    2. Exudative age-related macular degeneration of right eye with active choroidal neovascularization (HCC)  H35.3211 OCT, Retina - OU - Both Eyes    Intravitreal Injection,  Pharmacologic Agent - OD - Right Eye    bevacizumab (AVASTIN) SOSY 2.5 mg    CANCELED: Color Fundus Photography Optos - OU - Both Eyes    3. Vitreomacular adhesion of left eye  H43.822     4. Macular pucker, left eye  H35.372     5. Advanced nonexudative age-related macular degeneration of left eye with subfoveal involvement  H35.3124     6. Cystoid macular edema of left eye  H35.352       1.  OD with stabilized acuity on treatment for wet AMD.  10-week interval follow-up OD.  Repeat injection today and reevaluate next in 11 weeks  2.  OS with center involved disciform scar yet less CME.  Some component of CME may be from ERM and VMT OS  3.  Repeat intravitreal Avastin OU today follow-up next OU in 11 weeks  Ophthalmic Meds Ordered this visit:  Meds ordered this encounter  Medications   bevacizumab (AVASTIN) SOSY 2.5 mg   bevacizumab (AVASTIN) SOSY 2.5 mg       Return in about 11 weeks (around 11/11/2021) for DILATE OU, AVASTIN OCT, OD, OS.  There are no Patient Instructions on file for this visit.   Explained the diagnoses, plan, and follow up with the patient and they expressed understanding.  Patient expressed understanding of the importance of proper follow up care.   Clent Demark Mackenzie Groom M.D. Diseases & Surgery of the Retina and Vitreous [facility] 08/26/21     Abbreviations: M myopia (nearsighted); A astigmatism; H hyperopia (farsighted); P presbyopia; Mrx spectacle prescription;  CTL contact lenses; OD right eye; OS left eye; OU both eyes  XT  exotropia; ET esotropia; PEK punctate epithelial keratitis; PEE punctate epithelial erosions; DES dry eye syndrome; MGD meibomian gland dysfunction; ATs artificial tears; PFAT's preservative free artificial tears; [place] nuclear sclerotic cataract; PSC posterior subcapsular cataract; ERM epi-retinal membrane; PVD posterior vitreous detachment; RD retinal detachment; DM diabetes mellitus; DR diabetic retinopathy; NPDR non-proliferative diabetic retinopathy; PDR proliferative diabetic retinopathy; CSME clinically significant macular edema; DME diabetic macular edema; dbh dot blot hemorrhages; CWS cotton wool spot; POAG primary open angle glaucoma; C/D cup-to-disc ratio; HVF humphrey visual field; GVF goldmann visual field; OCT optical coherence tomography; IOP intraocular pressure; BRVO Branch retinal vein occlusion; CRVO central retinal vein occlusion; CRAO central retinal artery occlusion; BRAO branch retinal artery occlusion; RT retinal tear; SB scleral buckle; PPV pars plana vitrectomy; VH Vitreous hemorrhage; PRP panretinal laser photocoagulation; IVK intravitreal kenalog; VMT vitreomacular traction; MH Macular hole;  NVD neovascularization of the disc; NVE neovascularization elsewhere; AREDS age related eye disease study; ARMD age related macular degeneration; POAG primary open angle glaucoma; EBMD epithelial/anterior basement membrane dystrophy; ACIOL anterior chamber intraocular lens; IOL intraocular lens; PCIOL posterior chamber intraocular lens; Phaco/IOL phacoemulsification with intraocular lens placement; [place] photorefractive keratectomy; LASIK laser assisted in situ keratomileusis; HTN hypertension; DM diabetes mellitus; COPD chronic obstructive pulmonary disease

## 2021-10-12 DIAGNOSIS — Z0100 Encounter for examination of eyes and vision without abnormal findings: Secondary | ICD-10-CM | POA: Diagnosis not present

## 2021-10-12 DIAGNOSIS — H52223 Regular astigmatism, bilateral: Secondary | ICD-10-CM | POA: Diagnosis not present

## 2021-11-11 ENCOUNTER — Ambulatory Visit (INDEPENDENT_AMBULATORY_CARE_PROVIDER_SITE_OTHER): Payer: Medicare HMO | Admitting: Ophthalmology

## 2021-11-11 ENCOUNTER — Encounter (INDEPENDENT_AMBULATORY_CARE_PROVIDER_SITE_OTHER): Payer: Self-pay | Admitting: Ophthalmology

## 2021-11-11 DIAGNOSIS — H43821 Vitreomacular adhesion, right eye: Secondary | ICD-10-CM

## 2021-11-11 DIAGNOSIS — H353211 Exudative age-related macular degeneration, right eye, with active choroidal neovascularization: Secondary | ICD-10-CM | POA: Diagnosis not present

## 2021-11-11 DIAGNOSIS — H35372 Puckering of macula, left eye: Secondary | ICD-10-CM | POA: Diagnosis not present

## 2021-11-11 DIAGNOSIS — H43822 Vitreomacular adhesion, left eye: Secondary | ICD-10-CM | POA: Diagnosis not present

## 2021-11-11 DIAGNOSIS — H353221 Exudative age-related macular degeneration, left eye, with active choroidal neovascularization: Secondary | ICD-10-CM

## 2021-11-11 MED ORDER — BEVACIZUMAB CHEMO INJECTION 1.25MG/0.05ML SYRINGE FOR KALEIDOSCOPE
1.2500 mg | INTRAVITREAL | Status: AC | PRN
  Administered 2021-11-11: 1.25 mg via INTRAVITREAL

## 2021-11-11 NOTE — Assessment & Plan Note (Signed)
Excellent acuity and extension of interval to 11 weeks was mostly successful however there is a region superior to FAZ with intraretinal fluid.  Repeat injection today follow-up next in 10 weeks

## 2021-11-11 NOTE — Assessment & Plan Note (Signed)
No impact on acuity

## 2021-11-11 NOTE — Assessment & Plan Note (Signed)
No impact on overall disease

## 2021-11-11 NOTE — Assessment & Plan Note (Signed)
Continue to monitor because of foveal attachment

## 2021-11-11 NOTE — Progress Notes (Signed)
11/11/2021     CHIEF COMPLAINT Patient presents for  Chief Complaint  Patient presents with   Macular Degeneration      HISTORY OF PRESENT ILLNESS: Deborah Solomon is a 86 y.o. female who presents to the clinic today for:   HPI   11 weeks for DILATE OU, AVASTIN OCT, OD, OS. Pt stated vision has improved. Pt reports new glasses has improved since last visit.  Last edited by Silvestre Moment on 11/11/2021  8:28 AM.      Referring physician: Phylliss Blakes, OD [address] [place],  [place] [address]  HISTORICAL INFORMATION:   Selected notes from the MEDICAL RECORD NUMBER       CURRENT MEDICATIONS: No current outpatient medications on file. (Ophthalmic Drugs)   No current facility-administered medications for this visit. (Ophthalmic Drugs)   Current Outpatient Medications (Other)  Medication Sig   amLODipine (NORVASC) 5 MG tablet Take 5 mg by mouth daily.   carvedilol (COREG) 25 MG tablet Take 25 mg by mouth 2 (two) times daily.   doxazosin (CARDURA) 8 MG tablet Take 12 mg by mouth at bedtime.   estradiol (ESTRACE) 0.1 MG/GM vaginal cream Place 1 g vaginally 2 (two) times a week.   rosuvastatin (CRESTOR) 10 MG tablet Take 10 mg by mouth at bedtime.   No current facility-administered medications for this visit. (Other)      REVIEW OF SYSTEMS: ROS   Negative for: Constitutional, Gastrointestinal, Neurological, Skin, Genitourinary, Musculoskeletal, HENT, Endocrine, Cardiovascular, Eyes, Respiratory, Psychiatric, Allergic/Imm, Heme/Lymph Last edited by Silvestre Moment on 11/11/2021  8:28 AM.       ALLERGIES No Known Allergies  PAST MEDICAL HISTORY Past Medical History:  Diagnosis Date   Hypertension    Past Surgical History:  Procedure Laterality Date   CATARACT EXTRACTION Bilateral 08/31/1998    FAMILY HISTORY History reviewed. No pertinent family history.  SOCIAL HISTORY Social History   Tobacco Use   Smoking status: Never   Smokeless tobacco: Never          OPHTHALMIC EXAM:  Base Eye Exam     Visual Acuity (ETDRS)       Right Left   Dist cc 20/20 -2 CF at 2'    Correction: Glasses         Tonometry (Tonopen, 8:33 AM)       Right Left   Pressure 8 10         Pupils       Pupils APD   Right PERRL None   Left PERRL None         Visual Fields       Left Right     Full   Restrictions Partial inner superior temporal, inferior temporal, superior nasal, inferior nasal deficiencies          Extraocular Movement       Right Left    Full, Ortho Full, Ortho         Neuro/Psych     Oriented x3: Yes   Mood/Affect: Normal         Dilation     Both eyes: 1.0% Mydriacyl, 2.5% Phenylephrine @ 8:33 AM           Slit Lamp and Fundus Exam     External Exam       Right Left   External Normal Normal         Slit Lamp Exam       Right Left  Lids/Lashes Normal Normal   Conjunctiva/Sclera White and quiet White and quiet   Cornea Clear Clear   Anterior Chamber Deep and quiet Deep and quiet   Iris Round and reactive Round and reactive   Lens Posterior chamber intraocular lens Posterior chamber intraocular lens   Anterior Vitreous Normal Normal         Fundus Exam       Right Left   Posterior Vitreous Normal Normal   Disc Normal Normal   C/D Ratio 0.4 0.3   Macula Geographic atrophy, Retinal pigment epithelial mottling, Subretinal hemorrhage.  Aspect of macula Retinal pigment epithelial atrophy, Disciform scar, Geographic atrophy, Cystoid macular edema, Subretinal hemorrhage inferior aspect of macula   Vessels Normal Normal   Periphery Normal Normal            IMAGING AND PROCEDURES  Imaging and Procedures for 11/11/21  OCT, Retina - OU - Both Eyes       Right Eye Quality was good. Scan locations included subfoveal. Central Foveal Thickness: 223. Progression has been stable. Findings include abnormal foveal contour, intraretinal fluid, outer retinal atrophy, vitreomacular adhesion .    Left Eye Central Foveal Thickness: 428. Progression has been stable. Findings include cystoid macular edema, disciform scar, epiretinal membrane, outer retinal atrophy, vitreomacular adhesion .   Notes Intraretinal fluid superior aspect of macula.  At 11-week follow-up post Avastin.  Good acuity.  Will need repeat injection today shorten interval follow-up to 10 weeks     Intravitreal Injection, Pharmacologic Agent - OD - Right Eye       Time Out 11/11/2021. 9:23 AM. Confirmed correct patient, procedure, site, and patient consented.   Anesthesia Topical anesthesia was used. Anesthetic medications included Lidocaine 4%.   Procedure Preparation included 5% betadine to ocular surface, 10% betadine to eyelids, Tobramycin 0.3%. A 30 gauge needle was used.   Injection: 1.25 mg Bevacizumab 1.'25mg'$ /0.70m   Route: Intravitreal, Site: Right Eye   NDC: 5H061816 Lot:: [id number] Expiration date: 01/26/2022   Post-op Post injection exam found visual acuity of at least counting fingers. The patient tolerated the procedure well. There were no complications. The patient received written and verbal post procedure care education. Post injection medications included ocuflox.      Intravitreal Injection, Pharmacologic Agent - OS - Left Eye       Time Out 11/11/2021. 9:23 AM. Confirmed correct patient, procedure, site, and patient consented.   Procedure Preparation included 5% betadine to ocular surface, 10% betadine to eyelids. A 30 gauge needle was used.   Injection: 1.25 mg Bevacizumab 1.'25mg'$ /0.067m  Route: Intravitreal, Site: Left Eye   NDC: [id number] Post-op Post injection exam found visual acuity of at least counting fingers. The patient tolerated the procedure well. There were no complications. The patient received written and verbal post procedure care education. Post injection medications included ocuflox.              ASSESSMENT/PLAN:  Exudative age-related  macular degeneration of left eye with active choroidal neovascularization (HCC) Subfoveal disciform scar but overall no enlargement of scotoma and maintain 11-week interval today  Exudative age-related macular degeneration of right eye with active choroidal neovascularization (HCC) Excellent acuity and extension of interval to 11 weeks was mostly successful however there is a region superior to FAMonticelloith intraretinal fluid.  Repeat injection today follow-up next in 10 weeks  Macular pucker, left eye No impact on acuity  Vitreomacular adhesion of right eye Continue to monitor because of foveal attachment  Vitreomacular adhesion of left eye No impact on overall disease     ICD-10-CM   1. Exudative age-related macular degeneration of left eye with active choroidal neovascularization (HCC)  H35.3221 OCT, Retina - OU - Both Eyes    Intravitreal Injection, Pharmacologic Agent - OS - Left Eye    Bevacizumab (AVASTIN) SOLN 1.25 mg    2. Exudative age-related macular degeneration of right eye with active choroidal neovascularization (HCC)  H35.3211 OCT, Retina - OU - Both Eyes    Intravitreal Injection, Pharmacologic Agent - OD - Right Eye    Bevacizumab (AVASTIN) SOLN 1.25 mg    3. Macular pucker, left eye  H35.372     4. Vitreomacular adhesion of right eye  H43.821     5. Vitreomacular adhesion of left eye  H43.822       1.  We will need to repeat injection today to maintain and prevent progression of CNVM with good acuity maintained OD  2.  OS repeat injection today to prevent enlargement of scotoma  3.  Ophthalmic Meds Ordered this visit:  Meds ordered this encounter  Medications   Bevacizumab (AVASTIN) SOLN 1.25 mg   Bevacizumab (AVASTIN) SOLN 1.25 mg       Return in about 10 weeks (around 01/20/2022) for DILATE OU, AVASTIN OCT, OD, OS.  There are no Patient Instructions on file for this visit.   Explained the diagnoses, plan, and follow up with the patient and they  expressed understanding.  Patient expressed understanding of the importance of proper follow up care.   Clent Demark Sanav Remer M.D. Diseases & Surgery of the Retina and Vitreous [facility] 11/11/21     Abbreviations: M myopia (nearsighted); A astigmatism; H hyperopia (farsighted); P presbyopia; Mrx spectacle prescription;  CTL contact lenses; OD right eye; OS left eye; OU both eyes  XT exotropia; ET esotropia; PEK punctate epithelial keratitis; PEE punctate epithelial erosions; DES dry eye syndrome; MGD meibomian gland dysfunction; ATs artificial tears; PFAT's preservative free artificial tears; [place] nuclear sclerotic cataract; PSC posterior subcapsular cataract; ERM epi-retinal membrane; PVD posterior vitreous detachment; RD retinal detachment; DM diabetes mellitus; DR diabetic retinopathy; NPDR non-proliferative diabetic retinopathy; PDR proliferative diabetic retinopathy; CSME clinically significant macular edema; DME diabetic macular edema; dbh dot blot hemorrhages; CWS cotton wool spot; POAG primary open angle glaucoma; C/D cup-to-disc ratio; HVF humphrey visual field; GVF goldmann visual field; OCT optical coherence tomography; IOP intraocular pressure; BRVO Branch retinal vein occlusion; CRVO central retinal vein occlusion; CRAO central retinal artery occlusion; BRAO branch retinal artery occlusion; RT retinal tear; SB scleral buckle; PPV pars plana vitrectomy; VH Vitreous hemorrhage; PRP panretinal laser photocoagulation; IVK intravitreal kenalog; VMT vitreomacular traction; MH Macular hole;  NVD neovascularization of the disc; NVE neovascularization elsewhere; AREDS age related eye disease study; ARMD age related macular degeneration; POAG primary open angle glaucoma; EBMD epithelial/anterior basement membrane dystrophy; ACIOL anterior chamber intraocular lens; IOL intraocular lens; PCIOL posterior chamber intraocular lens; Phaco/IOL phacoemulsification with intraocular lens placement;  [place] photorefractive keratectomy; LASIK laser assisted in situ keratomileusis; HTN hypertension; DM diabetes mellitus; COPD chronic obstructive pulmonary disease

## 2021-11-11 NOTE — Assessment & Plan Note (Signed)
Subfoveal disciform scar but overall no enlargement of scotoma and maintain 11-week interval today

## 2022-01-07 DIAGNOSIS — E782 Mixed hyperlipidemia: Secondary | ICD-10-CM | POA: Diagnosis not present

## 2022-01-07 DIAGNOSIS — Z139 Encounter for screening, unspecified: Secondary | ICD-10-CM | POA: Diagnosis not present

## 2022-01-07 DIAGNOSIS — E042 Nontoxic multinodular goiter: Secondary | ICD-10-CM | POA: Diagnosis not present

## 2022-01-07 DIAGNOSIS — I10 Essential (primary) hypertension: Secondary | ICD-10-CM | POA: Diagnosis not present

## 2022-01-07 DIAGNOSIS — E1169 Type 2 diabetes mellitus with other specified complication: Secondary | ICD-10-CM | POA: Diagnosis not present

## 2022-01-07 DIAGNOSIS — E785 Hyperlipidemia, unspecified: Secondary | ICD-10-CM | POA: Diagnosis not present

## 2022-01-20 ENCOUNTER — Encounter (INDEPENDENT_AMBULATORY_CARE_PROVIDER_SITE_OTHER): Payer: Medicare HMO | Admitting: Ophthalmology

## 2022-01-20 DIAGNOSIS — H43821 Vitreomacular adhesion, right eye: Secondary | ICD-10-CM | POA: Diagnosis not present

## 2022-01-20 DIAGNOSIS — H353231 Exudative age-related macular degeneration, bilateral, with active choroidal neovascularization: Secondary | ICD-10-CM | POA: Diagnosis not present

## 2022-01-20 DIAGNOSIS — H353124 Nonexudative age-related macular degeneration, left eye, advanced atrophic with subfoveal involvement: Secondary | ICD-10-CM | POA: Diagnosis not present

## 2022-01-20 DIAGNOSIS — H35352 Cystoid macular degeneration, left eye: Secondary | ICD-10-CM | POA: Diagnosis not present

## 2022-01-20 DIAGNOSIS — H35372 Puckering of macula, left eye: Secondary | ICD-10-CM | POA: Diagnosis not present

## 2022-01-20 DIAGNOSIS — H353112 Nonexudative age-related macular degeneration, right eye, intermediate dry stage: Secondary | ICD-10-CM | POA: Diagnosis not present

## 2022-02-16 DIAGNOSIS — N8111 Cystocele, midline: Secondary | ICD-10-CM | POA: Diagnosis not present

## 2022-02-16 DIAGNOSIS — Z4689 Encounter for fitting and adjustment of other specified devices: Secondary | ICD-10-CM | POA: Diagnosis not present

## 2022-02-16 DIAGNOSIS — N952 Postmenopausal atrophic vaginitis: Secondary | ICD-10-CM | POA: Diagnosis not present

## 2022-02-16 DIAGNOSIS — Z79890 Hormone replacement therapy (postmenopausal): Secondary | ICD-10-CM | POA: Diagnosis not present

## 2022-02-28 DIAGNOSIS — H43822 Vitreomacular adhesion, left eye: Secondary | ICD-10-CM | POA: Diagnosis not present

## 2022-02-28 DIAGNOSIS — H353124 Nonexudative age-related macular degeneration, left eye, advanced atrophic with subfoveal involvement: Secondary | ICD-10-CM | POA: Diagnosis not present

## 2022-02-28 DIAGNOSIS — H35372 Puckering of macula, left eye: Secondary | ICD-10-CM | POA: Diagnosis not present

## 2022-02-28 DIAGNOSIS — H353112 Nonexudative age-related macular degeneration, right eye, intermediate dry stage: Secondary | ICD-10-CM | POA: Diagnosis not present

## 2022-02-28 DIAGNOSIS — H353221 Exudative age-related macular degeneration, left eye, with active choroidal neovascularization: Secondary | ICD-10-CM | POA: Diagnosis not present

## 2022-02-28 DIAGNOSIS — H353211 Exudative age-related macular degeneration, right eye, with active choroidal neovascularization: Secondary | ICD-10-CM | POA: Diagnosis not present

## 2022-02-28 DIAGNOSIS — H35352 Cystoid macular degeneration, left eye: Secondary | ICD-10-CM | POA: Diagnosis not present

## 2022-02-28 DIAGNOSIS — H43821 Vitreomacular adhesion, right eye: Secondary | ICD-10-CM | POA: Diagnosis not present

## 2022-04-18 DIAGNOSIS — H43821 Vitreomacular adhesion, right eye: Secondary | ICD-10-CM | POA: Diagnosis not present

## 2022-04-18 DIAGNOSIS — H43822 Vitreomacular adhesion, left eye: Secondary | ICD-10-CM | POA: Diagnosis not present

## 2022-04-18 DIAGNOSIS — H353211 Exudative age-related macular degeneration, right eye, with active choroidal neovascularization: Secondary | ICD-10-CM | POA: Diagnosis not present

## 2022-04-18 DIAGNOSIS — H35372 Puckering of macula, left eye: Secondary | ICD-10-CM | POA: Diagnosis not present

## 2022-04-18 DIAGNOSIS — H35352 Cystoid macular degeneration, left eye: Secondary | ICD-10-CM | POA: Diagnosis not present

## 2022-04-18 DIAGNOSIS — H353112 Nonexudative age-related macular degeneration, right eye, intermediate dry stage: Secondary | ICD-10-CM | POA: Diagnosis not present

## 2022-04-18 DIAGNOSIS — H353221 Exudative age-related macular degeneration, left eye, with active choroidal neovascularization: Secondary | ICD-10-CM | POA: Diagnosis not present

## 2022-05-30 DIAGNOSIS — H353113 Nonexudative age-related macular degeneration, right eye, advanced atrophic without subfoveal involvement: Secondary | ICD-10-CM | POA: Diagnosis not present

## 2022-05-30 DIAGNOSIS — H35372 Puckering of macula, left eye: Secondary | ICD-10-CM | POA: Diagnosis not present

## 2022-05-30 DIAGNOSIS — H353211 Exudative age-related macular degeneration, right eye, with active choroidal neovascularization: Secondary | ICD-10-CM | POA: Diagnosis not present

## 2022-05-30 DIAGNOSIS — H353221 Exudative age-related macular degeneration, left eye, with active choroidal neovascularization: Secondary | ICD-10-CM | POA: Diagnosis not present

## 2022-05-30 DIAGNOSIS — H43822 Vitreomacular adhesion, left eye: Secondary | ICD-10-CM | POA: Diagnosis not present

## 2022-05-30 DIAGNOSIS — H43821 Vitreomacular adhesion, right eye: Secondary | ICD-10-CM | POA: Diagnosis not present

## 2022-05-30 DIAGNOSIS — H353124 Nonexudative age-related macular degeneration, left eye, advanced atrophic with subfoveal involvement: Secondary | ICD-10-CM | POA: Diagnosis not present

## 2022-05-30 DIAGNOSIS — H35352 Cystoid macular degeneration, left eye: Secondary | ICD-10-CM | POA: Diagnosis not present

## 2022-07-13 DIAGNOSIS — Z9181 History of falling: Secondary | ICD-10-CM | POA: Diagnosis not present

## 2022-07-13 DIAGNOSIS — E782 Mixed hyperlipidemia: Secondary | ICD-10-CM | POA: Diagnosis not present

## 2022-07-13 DIAGNOSIS — E042 Nontoxic multinodular goiter: Secondary | ICD-10-CM | POA: Diagnosis not present

## 2022-07-13 DIAGNOSIS — E1169 Type 2 diabetes mellitus with other specified complication: Secondary | ICD-10-CM | POA: Diagnosis not present

## 2022-07-13 DIAGNOSIS — I10 Essential (primary) hypertension: Secondary | ICD-10-CM | POA: Diagnosis not present

## 2022-07-13 DIAGNOSIS — Z1331 Encounter for screening for depression: Secondary | ICD-10-CM | POA: Diagnosis not present

## 2022-07-13 DIAGNOSIS — Z139 Encounter for screening, unspecified: Secondary | ICD-10-CM | POA: Diagnosis not present

## 2022-07-18 DIAGNOSIS — H353113 Nonexudative age-related macular degeneration, right eye, advanced atrophic without subfoveal involvement: Secondary | ICD-10-CM | POA: Diagnosis not present

## 2022-07-18 DIAGNOSIS — H353211 Exudative age-related macular degeneration, right eye, with active choroidal neovascularization: Secondary | ICD-10-CM | POA: Diagnosis not present

## 2022-07-18 DIAGNOSIS — H43822 Vitreomacular adhesion, left eye: Secondary | ICD-10-CM | POA: Diagnosis not present

## 2022-07-18 DIAGNOSIS — H353221 Exudative age-related macular degeneration, left eye, with active choroidal neovascularization: Secondary | ICD-10-CM | POA: Diagnosis not present

## 2022-07-18 DIAGNOSIS — H35372 Puckering of macula, left eye: Secondary | ICD-10-CM | POA: Diagnosis not present

## 2022-07-18 DIAGNOSIS — H353124 Nonexudative age-related macular degeneration, left eye, advanced atrophic with subfoveal involvement: Secondary | ICD-10-CM | POA: Diagnosis not present

## 2022-07-18 DIAGNOSIS — H43821 Vitreomacular adhesion, right eye: Secondary | ICD-10-CM | POA: Diagnosis not present

## 2022-07-18 DIAGNOSIS — H35352 Cystoid macular degeneration, left eye: Secondary | ICD-10-CM | POA: Diagnosis not present

## 2022-08-17 DIAGNOSIS — N952 Postmenopausal atrophic vaginitis: Secondary | ICD-10-CM | POA: Diagnosis not present

## 2022-08-17 DIAGNOSIS — N8111 Cystocele, midline: Secondary | ICD-10-CM | POA: Diagnosis not present

## 2022-08-17 DIAGNOSIS — Z4689 Encounter for fitting and adjustment of other specified devices: Secondary | ICD-10-CM | POA: Diagnosis not present

## 2022-09-05 DIAGNOSIS — Z961 Presence of intraocular lens: Secondary | ICD-10-CM | POA: Diagnosis not present

## 2022-09-05 DIAGNOSIS — H35352 Cystoid macular degeneration, left eye: Secondary | ICD-10-CM | POA: Diagnosis not present

## 2022-09-05 DIAGNOSIS — H353231 Exudative age-related macular degeneration, bilateral, with active choroidal neovascularization: Secondary | ICD-10-CM | POA: Diagnosis not present

## 2022-09-05 DIAGNOSIS — H43821 Vitreomacular adhesion, right eye: Secondary | ICD-10-CM | POA: Diagnosis not present

## 2022-09-05 DIAGNOSIS — H353124 Nonexudative age-related macular degeneration, left eye, advanced atrophic with subfoveal involvement: Secondary | ICD-10-CM | POA: Diagnosis not present

## 2022-09-05 DIAGNOSIS — H43822 Vitreomacular adhesion, left eye: Secondary | ICD-10-CM | POA: Diagnosis not present

## 2022-09-05 DIAGNOSIS — H353113 Nonexudative age-related macular degeneration, right eye, advanced atrophic without subfoveal involvement: Secondary | ICD-10-CM | POA: Diagnosis not present

## 2022-09-05 DIAGNOSIS — H35372 Puckering of macula, left eye: Secondary | ICD-10-CM | POA: Diagnosis not present

## 2022-10-24 DIAGNOSIS — H353124 Nonexudative age-related macular degeneration, left eye, advanced atrophic with subfoveal involvement: Secondary | ICD-10-CM | POA: Diagnosis not present

## 2022-10-24 DIAGNOSIS — Z961 Presence of intraocular lens: Secondary | ICD-10-CM | POA: Diagnosis not present

## 2022-10-24 DIAGNOSIS — H353231 Exudative age-related macular degeneration, bilateral, with active choroidal neovascularization: Secondary | ICD-10-CM | POA: Diagnosis not present

## 2022-10-24 DIAGNOSIS — H35352 Cystoid macular degeneration, left eye: Secondary | ICD-10-CM | POA: Diagnosis not present

## 2022-10-24 DIAGNOSIS — H353113 Nonexudative age-related macular degeneration, right eye, advanced atrophic without subfoveal involvement: Secondary | ICD-10-CM | POA: Diagnosis not present

## 2022-10-24 DIAGNOSIS — H35372 Puckering of macula, left eye: Secondary | ICD-10-CM | POA: Diagnosis not present

## 2022-10-24 DIAGNOSIS — H43823 Vitreomacular adhesion, bilateral: Secondary | ICD-10-CM | POA: Diagnosis not present

## 2022-12-12 DIAGNOSIS — H353221 Exudative age-related macular degeneration, left eye, with active choroidal neovascularization: Secondary | ICD-10-CM | POA: Diagnosis not present

## 2022-12-12 DIAGNOSIS — H353211 Exudative age-related macular degeneration, right eye, with active choroidal neovascularization: Secondary | ICD-10-CM | POA: Diagnosis not present

## 2022-12-12 DIAGNOSIS — H353113 Nonexudative age-related macular degeneration, right eye, advanced atrophic without subfoveal involvement: Secondary | ICD-10-CM | POA: Diagnosis not present

## 2022-12-12 DIAGNOSIS — H43823 Vitreomacular adhesion, bilateral: Secondary | ICD-10-CM | POA: Diagnosis not present

## 2022-12-12 DIAGNOSIS — H35352 Cystoid macular degeneration, left eye: Secondary | ICD-10-CM | POA: Diagnosis not present

## 2022-12-12 DIAGNOSIS — H35372 Puckering of macula, left eye: Secondary | ICD-10-CM | POA: Diagnosis not present

## 2022-12-12 DIAGNOSIS — H353124 Nonexudative age-related macular degeneration, left eye, advanced atrophic with subfoveal involvement: Secondary | ICD-10-CM | POA: Diagnosis not present

## 2023-01-24 DIAGNOSIS — I10 Essential (primary) hypertension: Secondary | ICD-10-CM | POA: Diagnosis not present

## 2023-01-24 DIAGNOSIS — E1169 Type 2 diabetes mellitus with other specified complication: Secondary | ICD-10-CM | POA: Diagnosis not present

## 2023-01-24 DIAGNOSIS — E782 Mixed hyperlipidemia: Secondary | ICD-10-CM | POA: Diagnosis not present

## 2023-01-24 DIAGNOSIS — E042 Nontoxic multinodular goiter: Secondary | ICD-10-CM | POA: Diagnosis not present

## 2023-01-24 DIAGNOSIS — Z23 Encounter for immunization: Secondary | ICD-10-CM | POA: Diagnosis not present

## 2023-01-30 DIAGNOSIS — H353211 Exudative age-related macular degeneration, right eye, with active choroidal neovascularization: Secondary | ICD-10-CM | POA: Diagnosis not present

## 2023-01-30 DIAGNOSIS — H353124 Nonexudative age-related macular degeneration, left eye, advanced atrophic with subfoveal involvement: Secondary | ICD-10-CM | POA: Diagnosis not present

## 2023-01-30 DIAGNOSIS — H43822 Vitreomacular adhesion, left eye: Secondary | ICD-10-CM | POA: Diagnosis not present

## 2023-01-30 DIAGNOSIS — Z961 Presence of intraocular lens: Secondary | ICD-10-CM | POA: Diagnosis not present

## 2023-01-30 DIAGNOSIS — H35352 Cystoid macular degeneration, left eye: Secondary | ICD-10-CM | POA: Diagnosis not present

## 2023-01-30 DIAGNOSIS — H43821 Vitreomacular adhesion, right eye: Secondary | ICD-10-CM | POA: Diagnosis not present

## 2023-01-30 DIAGNOSIS — H353221 Exudative age-related macular degeneration, left eye, with active choroidal neovascularization: Secondary | ICD-10-CM | POA: Diagnosis not present

## 2023-01-30 DIAGNOSIS — H353113 Nonexudative age-related macular degeneration, right eye, advanced atrophic without subfoveal involvement: Secondary | ICD-10-CM | POA: Diagnosis not present

## 2023-01-30 DIAGNOSIS — H35372 Puckering of macula, left eye: Secondary | ICD-10-CM | POA: Diagnosis not present

## 2023-01-31 DIAGNOSIS — E1169 Type 2 diabetes mellitus with other specified complication: Secondary | ICD-10-CM | POA: Diagnosis not present

## 2023-02-15 DIAGNOSIS — N952 Postmenopausal atrophic vaginitis: Secondary | ICD-10-CM | POA: Diagnosis not present

## 2023-02-15 DIAGNOSIS — Z4689 Encounter for fitting and adjustment of other specified devices: Secondary | ICD-10-CM | POA: Diagnosis not present

## 2023-02-15 DIAGNOSIS — N8111 Cystocele, midline: Secondary | ICD-10-CM | POA: Diagnosis not present

## 2023-03-27 DIAGNOSIS — H353231 Exudative age-related macular degeneration, bilateral, with active choroidal neovascularization: Secondary | ICD-10-CM | POA: Diagnosis not present

## 2023-03-27 DIAGNOSIS — H43823 Vitreomacular adhesion, bilateral: Secondary | ICD-10-CM | POA: Diagnosis not present

## 2023-03-27 DIAGNOSIS — H353124 Nonexudative age-related macular degeneration, left eye, advanced atrophic with subfoveal involvement: Secondary | ICD-10-CM | POA: Diagnosis not present

## 2023-03-27 DIAGNOSIS — H35352 Cystoid macular degeneration, left eye: Secondary | ICD-10-CM | POA: Diagnosis not present

## 2023-03-27 DIAGNOSIS — H353113 Nonexudative age-related macular degeneration, right eye, advanced atrophic without subfoveal involvement: Secondary | ICD-10-CM | POA: Diagnosis not present

## 2023-03-27 DIAGNOSIS — H35372 Puckering of macula, left eye: Secondary | ICD-10-CM | POA: Diagnosis not present

## 2023-05-08 DIAGNOSIS — H35352 Cystoid macular degeneration, left eye: Secondary | ICD-10-CM | POA: Diagnosis not present

## 2023-05-08 DIAGNOSIS — H43821 Vitreomacular adhesion, right eye: Secondary | ICD-10-CM | POA: Diagnosis not present

## 2023-05-08 DIAGNOSIS — H43822 Vitreomacular adhesion, left eye: Secondary | ICD-10-CM | POA: Diagnosis not present

## 2023-05-08 DIAGNOSIS — H353113 Nonexudative age-related macular degeneration, right eye, advanced atrophic without subfoveal involvement: Secondary | ICD-10-CM | POA: Diagnosis not present

## 2023-05-08 DIAGNOSIS — H353221 Exudative age-related macular degeneration, left eye, with active choroidal neovascularization: Secondary | ICD-10-CM | POA: Diagnosis not present

## 2023-05-08 DIAGNOSIS — H353124 Nonexudative age-related macular degeneration, left eye, advanced atrophic with subfoveal involvement: Secondary | ICD-10-CM | POA: Diagnosis not present

## 2023-05-08 DIAGNOSIS — H353211 Exudative age-related macular degeneration, right eye, with active choroidal neovascularization: Secondary | ICD-10-CM | POA: Diagnosis not present

## 2023-05-08 DIAGNOSIS — H35372 Puckering of macula, left eye: Secondary | ICD-10-CM | POA: Diagnosis not present

## 2023-06-19 DIAGNOSIS — H35352 Cystoid macular degeneration, left eye: Secondary | ICD-10-CM | POA: Diagnosis not present

## 2023-06-19 DIAGNOSIS — H353221 Exudative age-related macular degeneration, left eye, with active choroidal neovascularization: Secondary | ICD-10-CM | POA: Diagnosis not present

## 2023-06-19 DIAGNOSIS — H353113 Nonexudative age-related macular degeneration, right eye, advanced atrophic without subfoveal involvement: Secondary | ICD-10-CM | POA: Diagnosis not present

## 2023-06-19 DIAGNOSIS — H35372 Puckering of macula, left eye: Secondary | ICD-10-CM | POA: Diagnosis not present

## 2023-06-19 DIAGNOSIS — Z961 Presence of intraocular lens: Secondary | ICD-10-CM | POA: Diagnosis not present

## 2023-06-19 DIAGNOSIS — H43823 Vitreomacular adhesion, bilateral: Secondary | ICD-10-CM | POA: Diagnosis not present

## 2023-06-19 DIAGNOSIS — H353124 Nonexudative age-related macular degeneration, left eye, advanced atrophic with subfoveal involvement: Secondary | ICD-10-CM | POA: Diagnosis not present

## 2023-06-19 DIAGNOSIS — H353211 Exudative age-related macular degeneration, right eye, with active choroidal neovascularization: Secondary | ICD-10-CM | POA: Diagnosis not present

## 2023-07-24 DIAGNOSIS — Z961 Presence of intraocular lens: Secondary | ICD-10-CM | POA: Diagnosis not present

## 2023-07-24 DIAGNOSIS — H35372 Puckering of macula, left eye: Secondary | ICD-10-CM | POA: Diagnosis not present

## 2023-07-24 DIAGNOSIS — H353231 Exudative age-related macular degeneration, bilateral, with active choroidal neovascularization: Secondary | ICD-10-CM | POA: Diagnosis not present

## 2023-07-24 DIAGNOSIS — H35352 Cystoid macular degeneration, left eye: Secondary | ICD-10-CM | POA: Diagnosis not present

## 2023-07-24 DIAGNOSIS — H43823 Vitreomacular adhesion, bilateral: Secondary | ICD-10-CM | POA: Diagnosis not present

## 2023-07-24 DIAGNOSIS — H353124 Nonexudative age-related macular degeneration, left eye, advanced atrophic with subfoveal involvement: Secondary | ICD-10-CM | POA: Diagnosis not present

## 2023-07-24 DIAGNOSIS — H353113 Nonexudative age-related macular degeneration, right eye, advanced atrophic without subfoveal involvement: Secondary | ICD-10-CM | POA: Diagnosis not present

## 2023-07-27 DIAGNOSIS — H353221 Exudative age-related macular degeneration, left eye, with active choroidal neovascularization: Secondary | ICD-10-CM | POA: Diagnosis not present

## 2023-07-27 DIAGNOSIS — E782 Mixed hyperlipidemia: Secondary | ICD-10-CM | POA: Diagnosis not present

## 2023-07-27 DIAGNOSIS — Z139 Encounter for screening, unspecified: Secondary | ICD-10-CM | POA: Diagnosis not present

## 2023-07-27 DIAGNOSIS — Z9181 History of falling: Secondary | ICD-10-CM | POA: Diagnosis not present

## 2023-07-27 DIAGNOSIS — Z23 Encounter for immunization: Secondary | ICD-10-CM | POA: Diagnosis not present

## 2023-07-27 DIAGNOSIS — E042 Nontoxic multinodular goiter: Secondary | ICD-10-CM | POA: Diagnosis not present

## 2023-07-27 DIAGNOSIS — E1169 Type 2 diabetes mellitus with other specified complication: Secondary | ICD-10-CM | POA: Diagnosis not present

## 2023-07-27 DIAGNOSIS — Z1331 Encounter for screening for depression: Secondary | ICD-10-CM | POA: Diagnosis not present

## 2023-07-27 DIAGNOSIS — I10 Essential (primary) hypertension: Secondary | ICD-10-CM | POA: Diagnosis not present

## 2023-08-28 DIAGNOSIS — H43822 Vitreomacular adhesion, left eye: Secondary | ICD-10-CM | POA: Diagnosis not present

## 2023-08-28 DIAGNOSIS — H353113 Nonexudative age-related macular degeneration, right eye, advanced atrophic without subfoveal involvement: Secondary | ICD-10-CM | POA: Diagnosis not present

## 2023-08-28 DIAGNOSIS — H353124 Nonexudative age-related macular degeneration, left eye, advanced atrophic with subfoveal involvement: Secondary | ICD-10-CM | POA: Diagnosis not present

## 2023-08-28 DIAGNOSIS — H35372 Puckering of macula, left eye: Secondary | ICD-10-CM | POA: Diagnosis not present

## 2023-08-28 DIAGNOSIS — H43821 Vitreomacular adhesion, right eye: Secondary | ICD-10-CM | POA: Diagnosis not present

## 2023-08-28 DIAGNOSIS — H353231 Exudative age-related macular degeneration, bilateral, with active choroidal neovascularization: Secondary | ICD-10-CM | POA: Diagnosis not present

## 2023-08-28 DIAGNOSIS — H35352 Cystoid macular degeneration, left eye: Secondary | ICD-10-CM | POA: Diagnosis not present

## 2023-09-05 DIAGNOSIS — Z4689 Encounter for fitting and adjustment of other specified devices: Secondary | ICD-10-CM | POA: Diagnosis not present

## 2023-09-05 DIAGNOSIS — N8111 Cystocele, midline: Secondary | ICD-10-CM | POA: Diagnosis not present

## 2023-09-05 DIAGNOSIS — N952 Postmenopausal atrophic vaginitis: Secondary | ICD-10-CM | POA: Diagnosis not present

## 2023-09-05 DIAGNOSIS — Z79890 Hormone replacement therapy (postmenopausal): Secondary | ICD-10-CM | POA: Diagnosis not present

## 2023-11-06 DIAGNOSIS — H353113 Nonexudative age-related macular degeneration, right eye, advanced atrophic without subfoveal involvement: Secondary | ICD-10-CM | POA: Diagnosis not present

## 2023-11-06 DIAGNOSIS — H353231 Exudative age-related macular degeneration, bilateral, with active choroidal neovascularization: Secondary | ICD-10-CM | POA: Diagnosis not present

## 2023-11-06 DIAGNOSIS — H35352 Cystoid macular degeneration, left eye: Secondary | ICD-10-CM | POA: Diagnosis not present

## 2023-11-06 DIAGNOSIS — H43823 Vitreomacular adhesion, bilateral: Secondary | ICD-10-CM | POA: Diagnosis not present

## 2023-11-06 DIAGNOSIS — Z961 Presence of intraocular lens: Secondary | ICD-10-CM | POA: Diagnosis not present

## 2023-11-06 DIAGNOSIS — H353124 Nonexudative age-related macular degeneration, left eye, advanced atrophic with subfoveal involvement: Secondary | ICD-10-CM | POA: Diagnosis not present

## 2023-11-06 DIAGNOSIS — H35372 Puckering of macula, left eye: Secondary | ICD-10-CM | POA: Diagnosis not present

## 2023-12-11 DIAGNOSIS — H35372 Puckering of macula, left eye: Secondary | ICD-10-CM | POA: Diagnosis not present

## 2023-12-11 DIAGNOSIS — H353113 Nonexudative age-related macular degeneration, right eye, advanced atrophic without subfoveal involvement: Secondary | ICD-10-CM | POA: Diagnosis not present

## 2023-12-11 DIAGNOSIS — H35352 Cystoid macular degeneration, left eye: Secondary | ICD-10-CM | POA: Diagnosis not present

## 2023-12-11 DIAGNOSIS — H43823 Vitreomacular adhesion, bilateral: Secondary | ICD-10-CM | POA: Diagnosis not present

## 2023-12-11 DIAGNOSIS — H353124 Nonexudative age-related macular degeneration, left eye, advanced atrophic with subfoveal involvement: Secondary | ICD-10-CM | POA: Diagnosis not present

## 2023-12-11 DIAGNOSIS — H353231 Exudative age-related macular degeneration, bilateral, with active choroidal neovascularization: Secondary | ICD-10-CM | POA: Diagnosis not present

## 2023-12-11 DIAGNOSIS — Z961 Presence of intraocular lens: Secondary | ICD-10-CM | POA: Diagnosis not present

## 2024-01-13 DEATH — deceased
